# Patient Record
Sex: Male | Born: 1975 | Race: Black or African American | Hispanic: No | Marital: Single | Smoking: Current every day smoker
Health system: Southern US, Community
[De-identification: ages and names within clinical notes are randomized; demographics above are authoritative.]

## PROBLEM LIST (undated history)

## (undated) DIAGNOSIS — G8929 Other chronic pain: Secondary | ICD-10-CM

## (undated) HISTORY — PX: HERNIA REPAIR: SHX51

## (undated) HISTORY — PX: MOUTH SURGERY: SHX715

---

## 2017-06-11 ENCOUNTER — Ambulatory Visit (INDEPENDENT_AMBULATORY_CARE_PROVIDER_SITE_OTHER): Payer: Medicaid Other | Admitting: Orthopaedic Surgery

## 2017-06-11 ENCOUNTER — Ambulatory Visit (INDEPENDENT_AMBULATORY_CARE_PROVIDER_SITE_OTHER): Payer: Medicaid Other

## 2017-06-11 ENCOUNTER — Encounter (INDEPENDENT_AMBULATORY_CARE_PROVIDER_SITE_OTHER): Payer: Self-pay | Admitting: Orthopaedic Surgery

## 2017-06-11 DIAGNOSIS — M25562 Pain in left knee: Secondary | ICD-10-CM

## 2017-06-11 DIAGNOSIS — M545 Low back pain, unspecified: Secondary | ICD-10-CM | POA: Insufficient documentation

## 2017-06-11 DIAGNOSIS — G8929 Other chronic pain: Secondary | ICD-10-CM

## 2017-06-11 MED ORDER — NABUMETONE 500 MG PO TABS: 500.0000 mg | ORAL_TABLET | Freq: Two times a day (BID) | ORAL | 2 refills | Status: DC | PRN

## 2017-06-11 MED ORDER — TIZANIDINE HCL 4 MG PO TABS: 4.0000 mg | ORAL_TABLET | Freq: Three times a day (TID) | ORAL | 0 refills | Status: DC | PRN

## 2017-06-11 MED ORDER — TRAMADOL HCL 50 MG PO TABS: 100.0000 mg | ORAL_TABLET | Freq: Four times a day (QID) | ORAL | 0 refills | Status: DC | PRN

## 2017-06-11 MED ORDER — LIDOCAINE HCL 1 % IJ SOLN
3.0000 mL | INTRAMUSCULAR | Status: AC | PRN
  Administered 2017-06-11: 3 mL

## 2017-06-11 MED ORDER — METHYLPREDNISOLONE ACETATE 40 MG/ML IJ SUSP
40.0000 mg | INTRAMUSCULAR | Status: AC | PRN
  Administered 2017-06-11: 40 mg via INTRA_ARTICULAR

## 2017-06-11 NOTE — Progress Notes (Signed)
Office Visit Note   Patient: Ricardo Khan           Date of Birth: 01-08-1976           MRN: [id number] Visit Date: 06/11/2017              Requested by: Fleet ContrasAvbuere, Edwin, MD [address] [place], [place] [id number] PCP: [facility]   Assessment & Plan: Visit Diagnoses:  1. Chronic pain of left knee   2. Chronic bilateral low back pain without sciatica     Plan: Given the fact that he has had some epidural injections in the past and likely facet joint injections I would like to repeat his MRI of his lumbar spine to assess where an intervention may be helpful for him because he is getting fed up in terms of not wanting any more injections in his back.  I did offer him a steroid injection his left knee and I think this was good treatment for that and he agreed with this.  I will give him a one-time prescription for tramadol and will refill his Zanaflex.  Also send the Relafen.  I will see him back in 3 weeks he is doing overall and hopefully can go for an MRI of his lumbar spine with him at that visit.  Questions concerns were answered and addressed.  Follow-Up Instructions: Return in about 3 weeks (around 07/02/2017).   Orders:  Orders Placed This Encounter  Procedures  . Large Joint Inj  . XR Knee 1-2 Views Left  . XR Lumbar Spine 2-3 Views   Meds ordered this encounter  Medications  . traMADol (ULTRAM) 50 MG tablet    Sig: Take 2 tablets (100 mg total) every 6 (six) hours as needed by mouth.    Dispense:  60 tablet    Refill:  0  . tiZANidine (ZANAFLEX) 4 MG tablet    Sig: Take 1 tablet (4 mg total) every 8 (eight) hours as needed by mouth for muscle spasms.    Dispense:  60 tablet    Refill:  0      Procedures: Large Joint Inj: L knee on 06/11/2017 2:45 PM Indications: diagnostic evaluation and pain Details: 22 G 1.5 in needle, superolateral approach  Arthrogram: No  Medications: 3 mL lidocaine 1 %; 40 mg methylPREDNISolone acetate 40 MG/ML Outcome: tolerated  well, no immediate complications Procedure, treatment alternatives, risks and benefits explained, specific risks discussed. Consent was given by the patient. Immediately prior to procedure a time out was called to verify the correct patient, procedure, equipment, support staff and site/side marked as required. Patient was prepped and draped in the usual sterile fashion.       Clinical Data: No additional findings.   Subjective: Chief Complaint  Patient presents with  . Left Knee - Pain   The patient is referred from his primary care physician to evaluate chronic low back pain as well as acute left knee pain.  Left knee he twisted about 2 months ago and felt like the patella may have come out of place.  He is someone who has had chronic back pain for some time now.  He said at least 1 or 2 epidural steroid [place] in [place] in that bag.  There is been nothing more recent in the spine is what bothers him the most as well.  He does report some pain in his back side that goes down his leg on occasion.  He denies any change in bowel  or bladder function.  He is currently on indomethacin and has had some Zanaflex as well. HPI  Review of Systems He currently denies any headache, chest pain, shortness of breath, fever, chills, nausea, vomiting.  Objective: Vital Signs: There were no vitals taken for this visit.  Physical Exam He is alert and oriented x3 and in no acute distress Ortho Exam Examination of his left knee shows pain over the medial patellofemoral retinacular ligament and guarding when I try to sublux the patella.  Otherwise the remaining part of his knee is normal exam.  He has some medial tenderness over the medial femoral condyle as well.  Flexion extension lumbar spine causes pain in the paraspinal muscles radiating to the sciatic area on the right side of his back. Specialty Comments:  No specialty comments available.  Imaging: Xr Knee 1-2 Views Left  Result Date:  06/11/2017 2 views of the knee on the left side no acute findings with well-maintained joint space.  Xr Lumbar Spine 2-3 Views  Result Date: 06/11/2017 An AP and lateral of the lumbar spine show slight disc space narrowing at L5-S1 but otherwise of the disc space is appearing more normal with normal lordosis on lateral view and straight alignment on the AP view.    PMFS History: Patient Active Problem List   Diagnosis Date Noted  . Chronic pain of left knee 06/11/2017  . Chronic bilateral low back pain without sciatica 06/11/2017   History reviewed. No pertinent past medical history.  History reviewed. No pertinent family history.  History reviewed. No pertinent surgical history. Social History   Occupational History  . Not on file  Tobacco Use  . Smoking status: Not on file  Substance and Sexual Activity  . Alcohol use: Not on file  . Drug use: Not on file  . Sexual activity: Not on file

## 2017-06-12 ENCOUNTER — Other Ambulatory Visit (INDEPENDENT_AMBULATORY_CARE_PROVIDER_SITE_OTHER): Payer: Self-pay

## 2017-06-12 DIAGNOSIS — M4807 Spinal stenosis, lumbosacral region: Secondary | ICD-10-CM

## 2017-07-02 ENCOUNTER — Ambulatory Visit (INDEPENDENT_AMBULATORY_CARE_PROVIDER_SITE_OTHER): Payer: Medicaid Other | Admitting: Orthopaedic Surgery

## 2017-07-02 ENCOUNTER — Other Ambulatory Visit (INDEPENDENT_AMBULATORY_CARE_PROVIDER_SITE_OTHER): Payer: Self-pay

## 2017-07-02 ENCOUNTER — Encounter (INDEPENDENT_AMBULATORY_CARE_PROVIDER_SITE_OTHER): Payer: Self-pay | Admitting: Orthopaedic Surgery

## 2017-07-02 DIAGNOSIS — G8929 Other chronic pain: Secondary | ICD-10-CM

## 2017-07-02 DIAGNOSIS — M545 Low back pain: Secondary | ICD-10-CM | POA: Diagnosis not present

## 2017-07-02 NOTE — Progress Notes (Signed)
The patient is here for follow-up of his lumbar spine.  Unfortunately we are unable to get an MRI approved through his insurance company because they want him to try other modalities first.  He has a history of low back pain and had a one-time intervention of an [place] in [place].  [place] was living [place] to [place]  He said that injection was very successful and he like to have an injection like that again but we have no records showing what level they injected whether it was a facet injection versus a translaminar type of injection.  He still denies any radicular symptoms going down his leg and is mainly chronic low back pain to the right side.  Is a thin individual.  He has excellent strength and normal sensation in his bilateral lower extremities.  He has pain in the facet joints in the paraspinal muscles of the right side of his lumbar spine.  We will send him to physical therapy to see if we can get this to feel better with any modalities to calm down his pain.  He will try to get information about where he is injection was done in the past in terms of anatomic location of his lumbar spine so we can potentially have him seen by Dr. Alvester MorinNewton here for intervention.  If not we would have to implore again from his insurance company that MRI would be warranted if this next round of conservative treatment fails.  We will see him back in 4 weeks.

## 2017-08-04 ENCOUNTER — Ambulatory Visit (INDEPENDENT_AMBULATORY_CARE_PROVIDER_SITE_OTHER): Payer: Medicaid Other | Admitting: Orthopaedic Surgery

## 2018-06-01 ENCOUNTER — Ambulatory Visit (HOSPITAL_COMMUNITY)
Admission: EM | Admit: 2018-06-01 | Discharge: 2018-06-01 | Disposition: A | Discharge status: Home / Self Care | Payer: Medicaid Other | Attending: Family Medicine | Admitting: Family Medicine

## 2018-06-01 ENCOUNTER — Encounter (HOSPITAL_COMMUNITY): Payer: Self-pay | Admitting: Emergency Medicine

## 2018-06-01 DIAGNOSIS — M545 Low back pain, unspecified: Secondary | ICD-10-CM

## 2018-06-01 MED ORDER — PREDNISONE 10 MG (48) PO TBPK: ORAL_TABLET | ORAL | 0 refills | Status: DC

## 2018-06-01 MED ORDER — CYCLOBENZAPRINE HCL 10 MG PO TABS: ORAL_TABLET | ORAL | 0 refills | Status: DC

## 2018-06-01 NOTE — ED Provider Notes (Signed)
[facility]   [id number] 06/01/18 Arrival Time: 1133  ASSESSMENT & PLAN:  1. Acute right-sided low back pain without sciatica   - acute on chronic  Meds ordered this encounter  Medications  . cyclobenzaprine (FLEXERIL) 10 MG tablet    Sig: Take 1 tablet by mouth 3 times daily as needed for muscle spasm. Warning: May cause drowsiness.    Dispense:  21 tablet    Refill:  0  . predniSONE (STERAPRED UNI-PAK 48 TAB) 10 MG (48) TBPK tablet    Sig: Take as directed.    Dispense:  48 tablet    Refill:  0   Medication sedation precautions. Encouraged ROM as he tolerates. May f/u here as needed. IF treatment above is not helping, I have recommended that he f/u with an orthopaedist to discuss further evaluation and treatment options.  Work note given.  Reviewed expectations re: course of current medical issues. Questions answered. Outlined signs and symptoms indicating need for more acute intervention. Patient verbalized understanding. After Visit Summary given.   SUBJECTIVE: History from: patient.  Ricardo Khan is a 42 y.o. male who presents with complaint of fairly persistent right sided lower back discomfort. Onset gradual, over the past few weeks. Injury/trama: no. History of back problems: same problem that worsens when outside temperatures get cold. Discomfort described as aching without radiation and with occasional sharp exacerbations. Certain movements exacerbate the described discomfort, especially lifting items above head. Better with rest. Extremity sensation changes or weakness: none. Ambulatory without difficulty. Normal bowel/bladder habits: yes. No associated abdominal pain/n/v. Self treatment: has over the counter analgesics without much help. Reports seeing "a specialist" in the past who did back injection and told him that he needs an MRI. Medicaid has not approved per patient. Has done short-term PT in the past; some help.  Reports no fevers, IV drug use,  or recent back surgeries or procedures.  ROS: As per HPI. All other systems negative.    OBJECTIVE:  Vitals:   06/01/18 1228  BP: (!) 136/57  Pulse: (!) 56  Resp: 18  Temp: 97.8 F (36.6 C)  TempSrc: Oral  SpO2: 100%    General appearance: alert; no distress Neck: supple with FROM; without midline tenderness CV: regular without murmer Lungs: unlabored respirations; symmetrical air entry Abdomen: soft, non-tender; non-distended Back: moderate right sided tenderness of his lower paraspinal musculature as well as tenderness over SI joint; FROM at hips; bruising: none; without midline tenderness Extremities: no edema; symmetrical with no gross deformities; normal ROM of bilateral lower extremities Skin: warm and dry Neurologic: normal gait but moves slowly; normal reflexes of RLE and LLE; normal sensation of RLE and LLE; normal strength of RLE and LLE Psychological: alert and cooperative; normal mood and affect  No Known Allergies  PMH: Back pain.  Social History   Socioeconomic History  . Marital status: Single    Spouse name: Not on file  . Number of children: Not on file  . Years of education: Not on file  . Highest education level: Not on file  Occupational History  . Not on file  Social Needs  . Financial resource strain: Not on file  . Food insecurity:    Worry: Not on file    Inability: Not on file  . Transportation needs:    Medical: Not on file    Non-medical: Not on file  Tobacco Use  . Smoking status: Current Every Day Smoker  . Smokeless tobacco: Never Used  Substance and Sexual  Activity  . Alcohol use: Yes  . Drug use: Never  . Sexual activity: Not on file  Lifestyle  . Physical activity:    Days per week: Not on file    Minutes per session: Not on file  . Stress: Not on file  Relationships  . Social connections:    Talks on phone: Not on file    Gets together: Not on file    Attends religious service: Not on file    Active member of club or  organization: Not on file    Attends meetings of clubs or organizations: Not on file    Relationship status: Not on file  . Intimate partner violence:    Fear of current or ex partner: Not on file    Emotionally abused: Not on file    Physically abused: Not on file    Forced sexual activity: Not on file  Other Topics Concern  . Not on file  Social History Narrative  . Not on file   FH: Question of HTN.  History reviewed. No pertinent surgical history.   Mardella Layman, MD 06/01/18 1250

## 2018-06-01 NOTE — ED Triage Notes (Signed)
Pt sts lower back pain chronic in nature worse with cold weather per pt

## 2018-06-01 NOTE — Discharge Instructions (Addendum)

## 2019-03-17 ENCOUNTER — Other Ambulatory Visit: Payer: Self-pay

## 2019-03-17 DIAGNOSIS — Z20822 Contact with and (suspected) exposure to covid-19: Secondary | ICD-10-CM

## 2019-03-18 LAB — NOVEL CORONAVIRUS, NAA: SARS-CoV-2, NAA: NOT DETECTED

## 2019-11-02 ENCOUNTER — Ambulatory Visit (HOSPITAL_COMMUNITY)
Admission: EM | Admit: 2019-11-02 | Discharge: 2019-11-02 | Disposition: A | Discharge status: Home / Self Care | Payer: Medicaid Other | Attending: Internal Medicine | Admitting: Internal Medicine

## 2019-11-02 ENCOUNTER — Other Ambulatory Visit: Payer: Self-pay

## 2019-11-02 ENCOUNTER — Encounter (HOSPITAL_COMMUNITY): Payer: Self-pay | Admitting: Emergency Medicine

## 2019-11-02 DIAGNOSIS — M25461 Effusion, right knee: Secondary | ICD-10-CM | POA: Diagnosis not present

## 2019-11-02 MED ORDER — PREDNISONE 20 MG PO TABS: 20.0000 mg | ORAL_TABLET | Freq: Every day | ORAL | 0 refills | Status: DC

## 2019-11-02 MED ORDER — IBUPROFEN 600 MG PO TABS: 600.0000 mg | ORAL_TABLET | Freq: Four times a day (QID) | ORAL | 0 refills | Status: DC | PRN

## 2019-11-02 NOTE — ED Provider Notes (Signed)
[facility]    CSN: [id number] Arrival date & time: 11/02/19  1359      History   Chief Complaint Chief Complaint  Patient presents with  . Knee Pain    HPI Ricardo Khan is a 44 y.o. male with chronic knee pain comes to urgent care with painful swelling of the right knee.  Symptoms started a few days ago.  Patient has started a new job which requires repetitive movement.  He has been loading in offloading boxes onto pallets.  After a couple of days of work he noticed pain and swelling in the right knee.  He denies any fall or trauma to the knee.  Pain is of moderate severity.  It is aggravated by full flexion of the knee.  No known relieving factors.  Patient has not tried any over-the-counter medications.  No numbness or tingling.  No erythema of the right knee.   HPI  History reviewed. No pertinent past medical history.  Patient Active Problem List   Diagnosis Date Noted  . Chronic pain of left knee 06/11/2017  . Chronic bilateral low back pain without sciatica 06/11/2017    History reviewed. No pertinent surgical history.     Home Medications    Prior to Admission medications   Medication Sig Start Date End Date Taking? Authorizing Provider  ibuprofen (ADVIL) 600 MG tablet Take 1 tablet (600 mg total) by mouth every 6 (six) hours as needed. 11/02/19   Loukas Antonson, Britta Mccreedy, MD  nabumetone (RELAFEN) 500 MG tablet Take 1 tablet (500 mg total) 2 (two) times daily as needed by mouth. 06/11/17   Kathryne Hitch, MD    Family History History reviewed. No pertinent family history.  Social History Social History   Tobacco Use  . Smoking status: Current Every Day Smoker  . Smokeless tobacco: Never Used  Substance Use Topics  . Alcohol use: Yes  . Drug use: Never     Allergies   Patient has no known allergies.   Review of Systems Review of Systems  Constitutional: Positive for activity change. Negative for chills and fever.  Respiratory: Negative.    Gastrointestinal: Negative for nausea and vomiting.  Genitourinary: Negative for discharge and penile pain.  Musculoskeletal: Positive for arthralgias and joint swelling. Negative for back pain and myalgias.  Skin: Negative.   Neurological: Negative for dizziness.     Physical Exam Triage Vital Signs ED Triage Vitals [11/02/19 1443]  Enc Vitals Group     BP 128/83     Pulse Rate (!) 58     Resp 18     Temp 98.9 F (37.2 C)     Temp Source Oral     SpO2 100 %     Weight      Height      Head Circumference      Peak Flow      Pain Score 7     Pain Loc      Pain Edu?      Excl. in GC?    No data found.  Updated Vital Signs BP 128/83 (BP Location: Right Arm)   Pulse (!) 58   Temp 98.9 F (37.2 C) (Oral)   Resp 18   SpO2 100%   Visual Acuity Right Eye Distance:   Left Eye Distance:   Bilateral Distance:    Right Eye Near:   Left Eye Near:    Bilateral Near:     Physical Exam Vitals and nursing note reviewed.  Constitutional:      General: He is not in acute distress.    Appearance: He is not ill-appearing.  Cardiovascular:     Rate and Rhythm: Normal rate and regular rhythm.  Pulmonary:     Effort: Pulmonary effort is normal.     Breath sounds: Normal breath sounds.  Abdominal:     General: Abdomen is flat. There is no distension.     Palpations: There is no mass.     Tenderness: There is no abdominal tenderness.  Musculoskeletal:        General: Swelling and tenderness present.  Skin:    General: Skin is warm.     Capillary Refill: Capillary refill takes less than 2 seconds.  Neurological:     Mental Status: He is alert.      UC Treatments / Results  Labs (all labs ordered are listed, but only abnormal results are displayed) Labs Reviewed - No data to display  EKG   Radiology No results found.  Procedures Procedures (including critical care time)  Medications Ordered in UC Medications - No data to display  Initial Impression /  Assessment and Plan / UC Course  I have reviewed the triage vital signs and the nursing notes.  Pertinent labs & imaging results that were available during my care of the patient were reviewed by me and considered in my medical decision making (see chart for details).     1.  Painful right knee swelling: Ibuprofen 600 mg every 6 hours as needed for pain I contemplated using prednisone but the patient recently had COVID-19 vaccine so I will avoid using steroids at this time Return precautions given No x-rays indicated at this time Right knee brace Gentle range of motion exercises and icing of the right knee. Final Clinical Impressions(s) / UC Diagnoses   Final diagnoses:  Knee effusion, right   Discharge Instructions   None    ED Prescriptions    Medication Sig Dispense Auth. Provider   predniSONE (DELTASONE) 20 MG tablet  (Status: Discontinued) Take 1 tablet (20 mg total) by mouth daily for 5 days. 5 tablet Brandace Cargle, Myrene Galas, MD   ibuprofen (ADVIL) 600 MG tablet Take 1 tablet (600 mg total) by mouth every 6 (six) hours as needed. 30 tablet Birch Farino, Myrene Galas, MD     PDMP not reviewed this encounter.   Chase Picket, MD 11/02/19 [phone]

## 2019-11-02 NOTE — ED Triage Notes (Signed)
Pt sts right knee pain with swelling; pt denies obvious injury but sts similar sx with left knee in past

## 2019-11-22 ENCOUNTER — Emergency Department (HOSPITAL_COMMUNITY): Payer: Medicaid Other

## 2019-11-22 ENCOUNTER — Encounter (HOSPITAL_COMMUNITY): Payer: Self-pay | Admitting: Emergency Medicine

## 2019-11-22 ENCOUNTER — Emergency Department (HOSPITAL_COMMUNITY)
Admission: EM | Admit: 2019-11-22 | Discharge: 2019-11-22 | Disposition: A | Discharge status: Home / Self Care | Payer: Medicaid Other | Attending: Emergency Medicine | Admitting: Emergency Medicine

## 2019-11-22 DIAGNOSIS — F17210 Nicotine dependence, cigarettes, uncomplicated: Secondary | ICD-10-CM | POA: Insufficient documentation

## 2019-11-22 DIAGNOSIS — G8929 Other chronic pain: Secondary | ICD-10-CM | POA: Insufficient documentation

## 2019-11-22 DIAGNOSIS — M545 Low back pain: Secondary | ICD-10-CM | POA: Insufficient documentation

## 2019-11-22 DIAGNOSIS — M25561 Pain in right knee: Secondary | ICD-10-CM | POA: Insufficient documentation

## 2019-11-22 MED ORDER — CYCLOBENZAPRINE HCL 10 MG PO TABS: 10.0000 mg | ORAL_TABLET | Freq: Two times a day (BID) | ORAL | 0 refills | Status: DC | PRN

## 2019-11-22 MED ORDER — LIDOCAINE 5 % EX PTCH
1.0000 | MEDICATED_PATCH | CUTANEOUS | Status: DC
  Administered 2019-11-22: 11:00:00 1 via TRANSDERMAL
  Filled 2019-11-22: qty 1

## 2019-11-22 MED ORDER — IBUPROFEN 800 MG PO TABS
800.0000 mg | ORAL_TABLET | Freq: Once | ORAL | Status: AC
  Administered 2019-11-22: 800 mg via ORAL
  Filled 2019-11-22: qty 1

## 2019-11-22 MED ORDER — NAPROXEN 500 MG PO TABS: 500.0000 mg | ORAL_TABLET | Freq: Two times a day (BID) | ORAL | 0 refills | Status: DC

## 2019-11-22 NOTE — Discharge Instructions (Addendum)
As discussed, your x-rays were negative for any acute abnormalities.  Your back x-ray showed some degenerative disease.  Please call the orthopedic at the number I provided for further evaluation of low back pain and knee pain.  I am sending you home with a pain medication called naproxen.  You may take it twice a day as needed for pain.  I am also sending you home with a muscle relaxer called Flexeril.  You may also take it twice a day as needed for pain.  Medication does cause drowsiness and do not drive or operate machinery while on the medication.  Continue to ice and elevate your right knee.  If symptoms do not improve within the next week, please follow-up with PCP for further evaluation.  I have included the number for [facility] if you do not have a PCP.  Return to the ER for new or worsening symptoms.  You may also purchase over the counter voltaren gel and lidoderm patches for added pain relief.

## 2019-11-22 NOTE — ED Triage Notes (Signed)
Pt has right knee pain for over 2 weeks that he noticed while at work with a lot of moving, pt also reports chronic back that has been bothering him as well. Pt does have mild swelling in right knee and has been wearing a knee brace and seen by UC on 3/30 for same.

## 2019-11-22 NOTE — ED Provider Notes (Signed)
[facility] EMERGENCY DEPARTMENT Provider Note   CSN: [id number] Arrival date & time: 11/22/19  2706     History Chief Complaint  Patient presents with  . Knee Pain  . Back Pain    Ricardo Khan is a 44 y.o. male with no significant past medical history who presents to the ED due to worsening right knee pain for the past 2 weeks associated with edema.  Patient notes swelling is worse after standing on his feet while at work.  Patient denies fever and chills.  Chart reviewed.  Patient was seen at urgent care on 11/02/2019 for the same complaint and prescribed ibuprofen with a knee brace.  He also admits to acute on chronic low back pain.  Has a history of numerous herniated disks in the lumbar region.  Denies previous back surgery.  He notes he has had injections in both his knee and his back with relief in the past.  No recent injections.  Denies saddle paresthesias, bowel/bladder incontinence, lower extremity weakness, IV drug use, and history of cancer.  Denies recent injury to right knee or low back.  Patient notes he recently moved to the area and has been unable to set up outpatient follow-up for his chronic back pain. Denies abdominal pain and urinary symptoms.  History obtained from patient and past medical records. No interpreter used during encounter.      History reviewed. No pertinent past medical history.  Patient Active Problem List   Diagnosis Date Noted  . Chronic pain of left knee 06/11/2017  . Chronic bilateral low back pain without sciatica 06/11/2017    No past surgical history on file.     No family history on file.  Social History   Tobacco Use  . Smoking status: Current Every Day Smoker  . Smokeless tobacco: Never Used  Substance Use Topics  . Alcohol use: Yes  . Drug use: Never    Home Medications Prior to Admission medications   Medication Sig Start Date End Date Taking? Authorizing Provider  cyclobenzaprine (FLEXERIL) 10 MG  tablet Take 1 tablet (10 mg total) by mouth 2 (two) times daily as needed for muscle spasms. 11/22/19   Mannie Stabile, PA-C  ibuprofen (ADVIL) 600 MG tablet Take 1 tablet (600 mg total) by mouth every 6 (six) hours as needed. 11/02/19   Lamptey, Britta Mccreedy, MD  nabumetone (RELAFEN) 500 MG tablet Take 1 tablet (500 mg total) 2 (two) times daily as needed by mouth. 06/11/17   Kathryne Hitch, MD  naproxen (NAPROSYN) 500 MG tablet Take 1 tablet (500 mg total) by mouth 2 (two) times daily. 11/22/19   Mannie Stabile, PA-C    Allergies    Patient has no known allergies.  Review of Systems   Review of Systems  Constitutional: Negative for chills and fever.  Eyes: Negative for visual disturbance.  Gastrointestinal: Negative for abdominal pain.  Genitourinary: Negative for dysuria.  Musculoskeletal: Positive for arthralgias, back pain and joint swelling.  All other systems reviewed and are negative.   Physical Exam Updated Vital Signs BP (!) 142/99 (BP Location: Right Arm)   Pulse 68   Temp 98.1 F (36.7 C) (Oral)   Resp 16   Ht 5\' 7"  (1.702 m)   Wt 68 kg   SpO2 99%   BMI 23.49 kg/m   Physical Exam Vitals and nursing note reviewed.  Constitutional:      General: He is not in acute distress.    Appearance:  He is not ill-appearing.  HENT:     Head: Normocephalic.  Eyes:     Pupils: Pupils are equal, round, and reactive to light.  Cardiovascular:     Rate and Rhythm: Normal rate and regular rhythm.     Pulses: Normal pulses.     Heart sounds: Normal heart sounds. No murmur. No friction rub. No gallop.   Pulmonary:     Effort: Pulmonary effort is normal.     Breath sounds: Normal breath sounds.  Abdominal:     General: Abdomen is flat. There is no distension.     Palpations: Abdomen is soft.     Tenderness: There is no abdominal tenderness. There is no guarding or rebound.  Musculoskeletal:     Cervical back: Neck supple.     Comments: Mild midline lumbar  tenderness. No stepoff or deformity. Bilateral lumbar paraspinal tenderness.  Tenderness palpation throughout right knee with notable edema.  Full range of motion of right knee.  Lower extremities neurovascularly intact.  Full range of motion of right hip and right ankle.  Soft Compartments.  Skin:    General: Skin is warm and dry.  Neurological:     General: No focal deficit present.     Mental Status: He is alert.  Psychiatric:        Mood and Affect: Mood normal.        Behavior: Behavior normal.     ED Results / Procedures / Treatments   Labs (all labs ordered are listed, but only abnormal results are displayed) Labs Reviewed - No data to display  EKG None  Radiology DG Lumbar Spine Complete  Result Date: 11/22/2019 CLINICAL DATA:  Chronic back pain. EXAM: LUMBAR SPINE - COMPLETE 4+ VIEW COMPARISON:  06/11/2017. FINDINGS: Five non-rib-bearing lumbar vertebrae. The L1 vertebra remains transitional with unfused transverse processes. Mild anterior spur formation and moderate disc space narrowing at the L5-S1 level. Mild-to-moderate anterior and lateral spur formation in the lower thoracic spine. No fractures, pars defects or subluxations. IMPRESSION: Degenerative changes, as described above. Electronically Signed   By: Beckie Salts M.D.   On: 11/22/2019 11:21   DG Knee Complete 4 Views Right  Result Date: 11/22/2019 CLINICAL DATA:  Right knee pain for 2 weeks, swelling, no injury. EXAM: RIGHT KNEE - COMPLETE 4+ VIEW COMPARISON:  None. FINDINGS: No joint effusion or acute osseous abnormality. No degenerative changes. IMPRESSION: No acute or degenerative findings. Electronically Signed   By: Leanna Battles M.D.   On: 11/22/2019 11:26    Procedures Procedures (including critical care time)  Medications Ordered in ED Medications  lidocaine (LIDODERM) 5 % 1 patch (1 patch Transdermal Patch Applied 11/22/19 1051)  ibuprofen (ADVIL) tablet 800 mg (800 mg Oral Given 11/22/19 1051)     ED Course  I have reviewed the triage vital signs and the nursing notes.  Pertinent labs & imaging results that were available during my care of the patient were reviewed by me and considered in my medical decision making (see chart for details).    MDM Rules/Calculators/A&P                     44 year old male presents to the ED due to worsening right knee pain x2 weeks associated with edema and acute on chronic low back pain for numerous decades.  Denies recent injury to knee or back.  Denies saddle paresthesias, bowel/bladder incontinence, lower extremity weakness, IV drug use, fever/chills, and history of cancer.  Upon arrival,  stable vitals.  Patient is afebrile, not tachycardic or hypoxic.  Patient in no acute distress and non-ill-appearing.  Tenderness throughout right knee with overlying edema.  Full range of motion of right knee. No erythema or warmth. Doubt septic arthritis.  Mild midline lumbar tenderness palpation in bilateral lumbar paraspinal tenderness.  Patient able to ambulate here in the ED without difficulty.  5/5 strength of bilateral lower extremities. Will obtain x-ray of right knee and lumbar spine to rule out acute abnormalities. Suspect back pain related to herniated disc. No concern for cauda equina or central cord compression. Ibuprofen and Lidoderm patch given here in the ED for pain relief.   Lumbar x-ray personally reviewed which demonstrates: Five non-rib-bearing lumbar vertebrae. The L1 vertebra remains  transitional with unfused transverse processes. Mild anterior spur  formation and moderate disc space narrowing at the L5-S1 level.  Mild-to-moderate anterior and lateral spur formation in the lower  thoracic spine. No fractures, pars defects or subluxations.   Knee x-ray personally reviewed which is negative for bony fracture or knee effusion.  Will discharge patient with symptomatic treatment with naproxen and Flexeril.  Patient discharged with orthopedic  referral.  Advised patient to call the office today to schedule an appointment for further evaluation of low back and right knee pain.  Right knee placed in Ace wrap to help with edema.  RICE discussed with patient. Strict ED precautions discussed with patient. Patient states understanding and agrees to plan. Patient discharged home in no acute distress and stable vitals  Final Clinical Impression(s) / ED Diagnoses Final diagnoses:  Chronic bilateral low back pain without sciatica  Acute pain of right knee    Rx / DC Orders ED Discharge Orders         Ordered    naproxen (NAPROSYN) 500 MG tablet  2 times daily     11/22/19 1152    cyclobenzaprine (FLEXERIL) 10 MG tablet  2 times daily PRN     11/22/19 1152           Suzy Bouchard, PA-C 11/22/19 1158    Charlesetta Shanks, MD 11/29/19 2305

## 2019-11-22 NOTE — ED Notes (Signed)
Pt transported to XR via wheelchair at this time.   

## 2019-12-10 ENCOUNTER — Other Ambulatory Visit: Payer: Self-pay | Admitting: Orthopedic Surgery

## 2019-12-10 DIAGNOSIS — S83241S Other tear of medial meniscus, current injury, right knee, sequela: Secondary | ICD-10-CM

## 2020-01-11 ENCOUNTER — Ambulatory Visit
Admission: RE | Admit: 2020-01-11 | Discharge: 2020-01-11 | Disposition: A | Discharge status: Home / Self Care | Payer: Medicaid Other | Source: Ambulatory Visit | Attending: Orthopedic Surgery | Admitting: Orthopedic Surgery

## 2020-01-11 ENCOUNTER — Other Ambulatory Visit: Payer: Self-pay | Admitting: Orthopedic Surgery

## 2020-01-11 ENCOUNTER — Other Ambulatory Visit: Payer: Self-pay

## 2020-01-11 DIAGNOSIS — S83241S Other tear of medial meniscus, current injury, right knee, sequela: Secondary | ICD-10-CM

## 2020-01-20 ENCOUNTER — Ambulatory Visit (HOSPITAL_COMMUNITY)
Admission: EM | Admit: 2020-01-20 | Discharge: 2020-01-20 | Disposition: A | Discharge status: Home / Self Care | Payer: Medicaid Other | Attending: Family Medicine | Admitting: Family Medicine

## 2020-01-20 ENCOUNTER — Encounter (HOSPITAL_COMMUNITY): Payer: Self-pay

## 2020-01-20 DIAGNOSIS — M2391 Unspecified internal derangement of right knee: Secondary | ICD-10-CM

## 2020-01-20 MED ORDER — TRAMADOL-ACETAMINOPHEN 37.5-325 MG PO TABS: 2.0000 | ORAL_TABLET | Freq: Four times a day (QID) | ORAL | 0 refills | Status: DC | PRN

## 2020-01-20 MED ORDER — MELOXICAM 15 MG PO TABS: 15.0000 mg | ORAL_TABLET | Freq: Every day | ORAL | 0 refills | Status: DC

## 2020-01-20 NOTE — ED Provider Notes (Signed)
[facility]    CSN: [id number] Arrival date & time: 01/20/20  3419      History   Chief Complaint Chief Complaint  Patient presents with  . Knee Injury    HPI Ricardo Khan is a 44 y.o. male.   HPI  Patient injured his knee a couple of months ago.  He was seen and referred to Dr. Marcello Fennel in orthopedics.  Dr. Carola Frost did a regular x-ray and then an MRI.  The MRI shows multiple findings including cartilage loss and torn meniscus. The patient states he is here because he has not been able to get an appointment for follow-up from Dr. Carola Frost He continues to have knee pain, knee swelling, difficulty on his job  History reviewed. No pertinent past medical history.  Patient Active Problem List   Diagnosis Date Noted  . Chronic pain of left knee 06/11/2017  . Chronic bilateral low back pain without sciatica 06/11/2017    History reviewed. No pertinent surgical history.     Home Medications    Prior to Admission medications   Medication Sig Start Date End Date Taking? Authorizing Provider  meloxicam (MOBIC) 15 MG tablet Take 1 tablet (15 mg total) by mouth daily. 01/20/20   Eustace Moore, MD  traMADol-acetaminophen (ULTRACET) 37.5-325 MG tablet Take 2 tablets by mouth every 6 (six) hours as needed for severe pain. 01/20/20   Eustace Moore, MD    Family History Family History  Problem Relation Age of Onset  . Healthy Mother   . Healthy Father     Social History Social History   Tobacco Use  . Smoking status: Current Every Day Smoker    Packs/day: 0.50    Types: Cigarettes  . Smokeless tobacco: Never Used  Substance Use Topics  . Alcohol use: Yes    Comment: occ  . Drug use: Never     Allergies   Patient has no known allergies.   Review of Systems Review of Systems   Physical Exam Triage Vital Signs ED Triage Vitals [01/20/20 0836]  Enc Vitals Group     BP (!) 133/92     Pulse Rate 65     Resp 16     Temp 98.7 F (37.1 C)     Temp  Source Oral     SpO2 99 %     Weight 150 lb (68 kg)     Height 5\' 8"  (1.727 m)     Head Circumference      Peak Flow      Pain Score 10     Pain Loc      Pain Edu?      Excl. in GC?    No data found.  Updated Vital Signs BP (!) 133/92   Pulse 65   Temp 98.7 F (37.1 C) (Oral)   Resp 16   Ht 5\' 8"  (1.727 m)   Wt 68 kg   SpO2 99%   BMI 22.81 kg/m       Physical Exam Constitutional:      General: He is not in acute distress.    Appearance: He is well-developed.  HENT:     Head: Normocephalic and atraumatic.  Eyes:     Conjunctiva/sclera: Conjunctivae normal.     Pupils: Pupils are equal, round, and reactive to light.  Cardiovascular:     Rate and Rhythm: Normal rate.  Pulmonary:     Effort: Pulmonary effort is normal. No respiratory distress.  Musculoskeletal:  General: Normal range of motion.     Cervical back: Normal range of motion.     Comments: Right knee has warmth, effusion, tenderness around the joint line.  No instability  Skin:    General: Skin is warm and dry.  Neurological:     Mental Status: He is alert.  Psychiatric:        Mood and Affect: Mood normal.        Behavior: Behavior normal.      UC Treatments / Results  Labs (all labs ordered are listed, but only abnormal results are displayed) Labs Reviewed - No data to display  EKG   Radiology No results found.  Procedures Procedures (including critical care time)  Medications Ordered in UC Medications - No data to display  Initial Impression / Assessment and Plan / UC Course  I have reviewed the triage vital signs and the nursing notes.  Pertinent labs & imaging results that were available during my care of the patient were reviewed by me and considered in my medical decision making (see chart for details).     We will refill an anti-inflammatory.  We will try Mobic once a day.  I am giving him Ultracet for severe pain.  He is cautioned that this may cause drowsiness.  He  is told he will not get refills of his controlled medication.  I called Dr. Carlean Jews office and secured an appointment for him next week. Final Clinical Impressions(s) / UC Diagnoses   Final diagnoses:  Internal derangement of multiple sites of right knee     Discharge Instructions     Take meloxicam once a day This is an anti-inflammatory medicine (do not take Advil while you are on meloxicam) Take Ultracet as needed for severe pain This will help you at bedtime/after work Follow-up with Dr. Marcelino Scot as scheduled Wednesday 01/26/20 at 11:15    ED Prescriptions    Medication Sig Dispense Auth. Provider   meloxicam (MOBIC) 15 MG tablet Take 1 tablet (15 mg total) by mouth daily. 30 tablet Raylene Everts, MD   traMADol-acetaminophen (ULTRACET) 37.5-325 MG tablet Take 2 tablets by mouth every 6 (six) hours as needed for severe pain. 30 tablet Raylene Everts, MD     I have reviewed the PDMP during this encounter.   Raylene Everts, MD 01/20/20 [phone]

## 2020-01-20 NOTE — ED Triage Notes (Signed)
Pt twisted right knee 2 mos ago and states the knee has been swelling ever since. Pt has trace swelling of right knee. Pt c/o 10/10 sharp pain in right knee. Pt has slight limp to exam room.

## 2020-01-20 NOTE — Discharge Instructions (Addendum)
Take meloxicam once a day This is an anti-inflammatory medicine (do not take Advil while you are on meloxicam) Take Ultracet as needed for severe pain This will help you at bedtime/after work Follow-up with Dr. Carola Frost as scheduled Wednesday 01/26/20 at 11:15

## 2020-02-16 ENCOUNTER — Other Ambulatory Visit: Payer: Self-pay

## 2020-02-16 ENCOUNTER — Ambulatory Visit (INDEPENDENT_AMBULATORY_CARE_PROVIDER_SITE_OTHER): Payer: Medicaid Other | Admitting: Orthopaedic Surgery

## 2020-02-16 DIAGNOSIS — S83241D Other tear of medial meniscus, current injury, right knee, subsequent encounter: Secondary | ICD-10-CM | POA: Diagnosis not present

## 2020-02-16 DIAGNOSIS — M1711 Unilateral primary osteoarthritis, right knee: Secondary | ICD-10-CM | POA: Diagnosis not present

## 2020-02-16 MED ORDER — METHYLPREDNISOLONE ACETATE 40 MG/ML IJ SUSP
40.0000 mg | INTRAMUSCULAR | Status: AC | PRN
  Administered 2020-02-16: 40 mg via INTRA_ARTICULAR

## 2020-02-16 MED ORDER — LIDOCAINE HCL 1 % IJ SOLN
3.0000 mL | INTRAMUSCULAR | Status: AC | PRN
  Administered 2020-02-16: 3 mL

## 2020-02-16 NOTE — Progress Notes (Signed)
Office Visit Note   Patient: Ricardo Khan           Date of Birth: 1976-06-08           MRN: [id number] Visit Date: 02/16/2020              Requested by: No referring provider defined for this encounter. PCP: Patient, No Pcp Per   Assessment & Plan: Visit Diagnoses:  1. Unilateral primary osteoarthritis, right knee   2. Acute medial meniscal tear, right, subsequent encounter     Plan: As he had done so well in the past with a steroid injection in his left knee, I recommended this for his right knee and he requested this as well.  He understands the risk and benefits of steroid injections.  He tolerated it well.  I would like to see him back in a month to see how he is doing overall.  Follow-Up Instructions: Return in about 4 weeks (around 03/15/2020).   Orders:  Orders Placed This Encounter  Procedures  . Large Joint Inj   No orders of the defined types were placed in this encounter.     Procedures: Large Joint Inj: R knee on 02/16/2020 11:00 AM Indications: diagnostic evaluation and pain Details: 22 G 1.5 in needle, superolateral approach  Arthrogram: No  Medications: 3 mL lidocaine 1 %; 40 mg methylPREDNISolone acetate 40 MG/ML Outcome: tolerated well, no immediate complications Procedure, treatment alternatives, risks and benefits explained, specific risks discussed. Consent was given by the patient. Immediately prior to procedure a time out was called to verify the correct patient, procedure, equipment, support staff and site/side marked as required. Patient was prepped and draped in the usual sterile fashion.       Clinical Data: No additional findings.   Subjective: Chief Complaint  Patient presents with  . Right Knee - Pain  The patient is seen here today for evaluation treatment of acute right knee pain.  He injured his knee 2-1/2 months ago and is at work and twisted the knee.  He felt a pop in the knee.  Eventually an MRI was obtained showing moderate to  severe arthritis in the medial compartment of his knee.  This is surprising for someone who is only 45 years old and very thin.  He does do a lot of work on his knees.  I actually saw him years ago and provide a steroid injection in his left knee for something similar.  He said that pain is gone and has never bothered him since then.  He has not had any type of steroid injection in his more recently injured right knee.  I believe the injection was held off before because he had had his second Covid vaccine in April and you always want a wait least 2 weeks after the Covid vaccination before placing a steroid in the body.  He is wearing an Ace wrap around his knee.  He does report some locking catching but most of his pain is around the medial joint line. HPI  Review of Systems He currently denies any headache, chest pain, shortness of breath, fever, chills, nausea, vomiting  Objective: Vital Signs: There were no vitals taken for this visit.  Physical Exam He is alert and orient x3 and in no acute distress Ortho Exam Examination of his right knee shows slight varus malalignment.  There is medial joint line tenderness.  There is no true McMurray's or Lachman's exam.  There is no effusion.  His range  of motion is full. Specialty Comments:  No specialty comments available.  Imaging: No results found. The MRI of his right knee does show small meniscal tearing of the medial meniscus.  There is areas of partial full-thickness cartilage loss in the medial compartment of the knee and reactive subchondral edema in the bone.    PMFS History: Patient Active Problem List   Diagnosis Date Noted  . Unilateral primary osteoarthritis, right knee 02/16/2020  . Chronic pain of left knee 06/11/2017  . Chronic bilateral low back pain without sciatica 06/11/2017   No past medical history on file.  Family History  Problem Relation Age of Onset  . Healthy Mother   . Healthy Father     No past surgical  history on file. Social History   Occupational History  . Not on file  Tobacco Use  . Smoking status: Current Every Day Smoker    Packs/day: 0.50    Types: Cigarettes  . Smokeless tobacco: Never Used  Substance and Sexual Activity  . Alcohol use: Yes    Comment: occ  . Drug use: Never  . Sexual activity: Not on file

## 2020-03-15 ENCOUNTER — Encounter: Payer: Self-pay | Admitting: Orthopaedic Surgery

## 2020-03-15 ENCOUNTER — Ambulatory Visit: Payer: Medicaid Other | Admitting: Orthopaedic Surgery

## 2020-03-15 ENCOUNTER — Ambulatory Visit (INDEPENDENT_AMBULATORY_CARE_PROVIDER_SITE_OTHER): Payer: Medicaid Other | Admitting: Physician Assistant

## 2020-03-15 DIAGNOSIS — S83241D Other tear of medial meniscus, current injury, right knee, subsequent encounter: Secondary | ICD-10-CM

## 2020-03-15 NOTE — Progress Notes (Signed)
HPI: Ricardo Khan returns today follow-up of his right knee status post injection with cortisone 02/16/2020.  States the injection took away the severe pain initially but the pain came back and he continues to have giving way sensation of the knee anytime he twists he has a painful pop within the knee.  Again he had an acute injury approximately 3-1/2 months ago at work and this was a twisting injury.  He states most of his pain is along medial joint line.  Then MRI of the knee did show moderate to severe arthritis medial compartment of his knee and mild cartilage fissuring along the posterior weightbearing portion of the lateral femoral condyle.  Also partial-thickness cartilage loss the posterior lateral tibial plateau.  Tiny or radial tear is seen the medial meniscus and also small tear along the edge of the posterior horn of the medial meniscus.  Radial tear lateral meniscus involving the body.  Review of systems: Please see HPI otherwise negative noncontributory.  Physical exam: General well-developed well-nourished male in no acute distress walks with an antalgic gait without the use of any assistive device. Right knee: Tenderness along medial joint line.  No abnormal warmth erythema or effusion.  McMurray's is positive.   Impression: Right knee acute meniscal tear Right knee arthritis right is  Plan: Due to the fact the patient is failed conservative treatment thus far and given his young age recommend knee arthroscopy for partial medial meniscectomy.  He understands this will not address the arthritic components of his knee.  He may benefit from supplemental injections in the past.  However given in his young age with thin body build and the fact he had no pain in the knee prior to 3-1/2 months ago when he had a twisting feel that most conservative treatment would be knee arthroscopy at this point time.  He understands risk benefits of surgery which include but are not limited to nerve vessel injury,  worsening pain, prolonged pain, DVT/PE and infection.  We will see him back 1 week postop.  Questions were encouraged and answered at length today

## 2020-09-20 ENCOUNTER — Ambulatory Visit (INDEPENDENT_AMBULATORY_CARE_PROVIDER_SITE_OTHER): Payer: Medicaid Other | Admitting: Physician Assistant

## 2020-09-20 ENCOUNTER — Encounter: Payer: Self-pay | Admitting: Physician Assistant

## 2020-09-20 ENCOUNTER — Ambulatory Visit: Payer: Self-pay

## 2020-09-20 DIAGNOSIS — M1711 Unilateral primary osteoarthritis, right knee: Secondary | ICD-10-CM | POA: Diagnosis not present

## 2020-09-20 DIAGNOSIS — S83241D Other tear of medial meniscus, current injury, right knee, subsequent encounter: Secondary | ICD-10-CM | POA: Diagnosis not present

## 2020-09-20 DIAGNOSIS — M25562 Pain in left knee: Secondary | ICD-10-CM

## 2020-09-20 DIAGNOSIS — G8929 Other chronic pain: Secondary | ICD-10-CM | POA: Diagnosis not present

## 2020-09-20 MED ORDER — MELOXICAM 15 MG PO TABS: 15.0000 mg | ORAL_TABLET | Freq: Every day | ORAL | 0 refills | Status: DC

## 2020-09-20 NOTE — Progress Notes (Signed)
HPI: Mr. Astorino returns today due to right knee pain.  Again he underwent an MRI of his right knee which showed moderate to severe arthritis in the medial compartment of his knee.  He also had a small meniscal tear in the medial meniscus.  And reactive subchondral cyst edema in the medial femoral condyle.  He states he continues to have giving way of the knee sharp pain.  He notes it feels like something is pulling within the knee at times.  He would like to undergo some type of surgery.  Review of systems: No fevers chills shortness of breath chest pain.  Physical exam General well-developed well-nourished male in no acute distress.  Ambulates without any assistive devices.  Right knee: Tenderness along medial joint line.  Slight effusion.  Full range of motion.  Positive McMurray's.  Impression: Right knee medial meniscal tear Right knee osteoarthritis   Plan: Given patient's young age may been like to do as conservative treatment as possible.  Therefore recommend right knee arthroscopy with medial meniscectomy and debridement.  He understands that he does have significant arthritis in his knee and that the knee arthroscopy may not relieve all of his pain.  He most likely will require supplemental injections in the future.  Risk benefits of surgery discussed with patient risk include but are not limited to infection, prolonged pain, and DVT.  He will follow up with [place] 1 week postop.  Questions were encouraged and answered at length.

## 2020-10-05 ENCOUNTER — Telehealth: Payer: Self-pay | Admitting: Orthopaedic Surgery

## 2020-10-05 NOTE — Telephone Encounter (Signed)
I called patient and scheduled surgery. 

## 2020-10-05 NOTE — Telephone Encounter (Signed)
Pt called stating he would like to take the 10/12/20 surgery date, as he has found someone to sit through it; pt would like a CB once that's been booked.   [phone]

## 2020-10-18 ENCOUNTER — Encounter: Payer: Self-pay | Admitting: Physician Assistant

## 2020-10-18 ENCOUNTER — Other Ambulatory Visit: Payer: Self-pay

## 2020-10-18 ENCOUNTER — Ambulatory Visit (INDEPENDENT_AMBULATORY_CARE_PROVIDER_SITE_OTHER): Payer: Medicaid Other | Admitting: Physician Assistant

## 2020-10-18 DIAGNOSIS — S83241D Other tear of medial meniscus, current injury, right knee, subsequent encounter: Secondary | ICD-10-CM

## 2020-10-18 MED ORDER — MELOXICAM 15 MG PO TABS: 15.0000 mg | ORAL_TABLET | Freq: Every day | ORAL | 0 refills | Status: DC

## 2020-10-18 NOTE — Addendum Note (Signed)
Addended by: Richardean Canal on: 10/18/2020 01:14 PM   Modules accepted: Orders

## 2020-10-18 NOTE — Progress Notes (Signed)
Ricardo Khan comes in today for refill on his Mobic.  He is also waiting for approval for right knee arthroscopy.  With working on this.  Domenica Reamer with getting a call wants the time and facility are known.  We will see him back 1 week postop.  No charge for today's office visit.

## 2020-10-25 ENCOUNTER — Telehealth: Payer: Self-pay | Admitting: Orthopaedic Surgery

## 2020-10-25 NOTE — Telephone Encounter (Signed)
Pt called stating he needs the details for his surgery; he states someone called him to set it up but didn't give him a definite date. Pt would like a CB with an update  [phone]

## 2020-10-27 NOTE — Telephone Encounter (Signed)
I called patient and left a voice mail for a return call. 

## 2020-10-31 ENCOUNTER — Other Ambulatory Visit: Payer: Self-pay | Admitting: Physician Assistant

## 2020-11-02 ENCOUNTER — Encounter (HOSPITAL_BASED_OUTPATIENT_CLINIC_OR_DEPARTMENT_OTHER): Payer: Self-pay | Admitting: Orthopaedic Surgery

## 2020-11-02 ENCOUNTER — Other Ambulatory Visit: Payer: Self-pay

## 2020-11-03 NOTE — Telephone Encounter (Signed)
Pt aware of surgery per Tammy at [facility].

## 2020-11-06 ENCOUNTER — Other Ambulatory Visit (HOSPITAL_COMMUNITY)
Admission: RE | Admit: 2020-11-06 | Discharge: 2020-11-06 | Disposition: A | Discharge status: Home / Self Care | Payer: Medicaid Other | Source: Ambulatory Visit | Attending: Orthopaedic Surgery | Admitting: Orthopaedic Surgery

## 2020-11-06 ENCOUNTER — Other Ambulatory Visit: Payer: Self-pay

## 2020-11-06 DIAGNOSIS — Z20822 Contact with and (suspected) exposure to covid-19: Secondary | ICD-10-CM | POA: Diagnosis not present

## 2020-11-06 DIAGNOSIS — Z01812 Encounter for preprocedural laboratory examination: Secondary | ICD-10-CM | POA: Diagnosis not present

## 2020-11-07 LAB — SARS CORONAVIRUS 2 (TAT 6-24 HRS): SARS Coronavirus 2: NEGATIVE

## 2020-11-08 DIAGNOSIS — S83231A Complex tear of medial meniscus, current injury, right knee, initial encounter: Secondary | ICD-10-CM

## 2020-11-08 NOTE — H&P (Signed)
Ricardo Khan is an 45 y.o. male.   Chief Complaint: Right knee pain with swelling, locking and catching HPI: The patient is a 45 year old male with right knee pain this been going on for some time now.  He has tried failed all forms of conservative treatment including activity modification, rest, anti-inflammatories, quad training exercises and steroid injections in that right knee.  A MRI was visualized and of the knee is showing small meniscal tears of the medial lateral meniscus but also subchondral reactive edema on the medial compartment of the knee and some areas of near full-thickness cartilage loss in the medial compartment.  At this point an arthroscopic intervention has been recommended to assess the meniscus and perform any partial meniscectomy as necessary as well as assessing the cartilage of the knee and perform any type of chondroplasty that is indicated.  History reviewed. No pertinent past medical history.  Past Surgical History:  Procedure Laterality Date  . HERNIA REPAIR    . MOUTH SURGERY      Family History  Problem Relation Age of Onset  . Healthy Mother   . Healthy Father    Social History:  reports that he has been smoking cigarettes. He has been smoking about 0.50 packs per day. He has never used smokeless tobacco. He reports current alcohol use. He reports that he does not use drugs.  Allergies: No Known Allergies  No medications prior to admission.    No results found for this or any previous visit (from the past 48 hour(s)). No results found.  Review of Systems  All other systems reviewed and are negative.   Height 5\' 7"  (1.702 m), weight 68 kg. Physical Exam Vitals reviewed.  Constitutional:      Appearance: Normal appearance.  HENT:     Head: Normocephalic and atraumatic.  Eyes:     Extraocular Movements: Extraocular movements intact.     Pupils: Pupils are equal, round, and reactive to light.  Cardiovascular:     Rate and Rhythm: Normal rate  and regular rhythm.     Pulses: Normal pulses.  Pulmonary:     Effort: Pulmonary effort is normal.     Breath sounds: Normal breath sounds.  Abdominal:     Palpations: Abdomen is soft.  Musculoskeletal:     Cervical back: Normal range of motion.     Right knee: Effusion present. Decreased range of motion. Tenderness present over the medial joint line. Abnormal meniscus.  Neurological:     Mental Status: He is alert and oriented to person, place, and time.  Psychiatric:        Behavior: Behavior normal.      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  The risk and benefits of surgery been explained in detail.  , MD 11/08/2020, 9:25 PM

## 2020-11-09 ENCOUNTER — Encounter (HOSPITAL_BASED_OUTPATIENT_CLINIC_OR_DEPARTMENT_OTHER): Payer: Self-pay | Admitting: Orthopaedic Surgery

## 2020-11-09 ENCOUNTER — Other Ambulatory Visit: Payer: Self-pay

## 2020-11-09 ENCOUNTER — Ambulatory Visit (HOSPITAL_BASED_OUTPATIENT_CLINIC_OR_DEPARTMENT_OTHER): Payer: Medicaid Other | Admitting: Anesthesiology

## 2020-11-09 ENCOUNTER — Ambulatory Visit (HOSPITAL_BASED_OUTPATIENT_CLINIC_OR_DEPARTMENT_OTHER)
Admission: RE | Admit: 2020-11-09 | Discharge: 2020-11-09 | Disposition: A | Discharge status: Home / Self Care | Payer: Medicaid Other | Attending: Orthopaedic Surgery | Admitting: Orthopaedic Surgery

## 2020-11-09 ENCOUNTER — Encounter (HOSPITAL_BASED_OUTPATIENT_CLINIC_OR_DEPARTMENT_OTHER)
Admission: RE | Disposition: A | Discharge status: Home / Self Care | Payer: Self-pay | Source: Home / Self Care | Attending: Orthopaedic Surgery

## 2020-11-09 DIAGNOSIS — X58XXXA Exposure to other specified factors, initial encounter: Secondary | ICD-10-CM | POA: Insufficient documentation

## 2020-11-09 DIAGNOSIS — F17210 Nicotine dependence, cigarettes, uncomplicated: Secondary | ICD-10-CM | POA: Diagnosis not present

## 2020-11-09 DIAGNOSIS — S83231A Complex tear of medial meniscus, current injury, right knee, initial encounter: Secondary | ICD-10-CM | POA: Diagnosis present

## 2020-11-09 DIAGNOSIS — Z8719 Personal history of other diseases of the digestive system: Secondary | ICD-10-CM | POA: Insufficient documentation

## 2020-11-09 DIAGNOSIS — S83231D Complex tear of medial meniscus, current injury, right knee, subsequent encounter: Secondary | ICD-10-CM | POA: Diagnosis not present

## 2020-11-09 HISTORY — PX: KNEE ARTHROSCOPY: SHX127

## 2020-11-09 SURGERY — ARTHROSCOPY, KNEE
Anesthesia: General | Site: Knee | Laterality: Right

## 2020-11-09 MED ORDER — GLYCOPYRROLATE 0.2 MG/ML IJ SOLN
INTRAMUSCULAR | Status: DC | PRN
  Administered 2020-11-09: .2 mg via INTRAVENOUS

## 2020-11-09 MED ORDER — KETOROLAC TROMETHAMINE 30 MG/ML IJ SOLN
30.0000 mg | Freq: Once | INTRAMUSCULAR | Status: AC | PRN
  Administered 2020-11-09: 30 mg via INTRAVENOUS

## 2020-11-09 MED ORDER — ACETAMINOPHEN 10 MG/ML IV SOLN
INTRAVENOUS | Status: AC
  Filled 2020-11-09: qty 100

## 2020-11-09 MED ORDER — DEXAMETHASONE SODIUM PHOSPHATE 4 MG/ML IJ SOLN
INTRAMUSCULAR | Status: DC | PRN
  Administered 2020-11-09: 10 mg via INTRAVENOUS

## 2020-11-09 MED ORDER — EPHEDRINE SULFATE 50 MG/ML IJ SOLN
INTRAMUSCULAR | Status: DC | PRN
  Administered 2020-11-09: 5 mg via INTRAVENOUS

## 2020-11-09 MED ORDER — LACTATED RINGERS IV SOLN: INTRAVENOUS | Status: DC

## 2020-11-09 MED ORDER — MORPHINE SULFATE (PF) 4 MG/ML IV SOLN
INTRAVENOUS | Status: DC | PRN
  Administered 2020-11-09: 4 mg via INTRAVENOUS

## 2020-11-09 MED ORDER — MIDAZOLAM HCL 5 MG/5ML IJ SOLN
INTRAMUSCULAR | Status: DC | PRN
  Administered 2020-11-09: 2 mg via INTRAVENOUS

## 2020-11-09 MED ORDER — SODIUM CHLORIDE 0.9 % IR SOLN
Status: DC | PRN
  Administered 2020-11-09: 3000 mL

## 2020-11-09 MED ORDER — MEPERIDINE HCL 25 MG/ML IJ SOLN: 6.2500 mg | INTRAMUSCULAR | Status: DC | PRN

## 2020-11-09 MED ORDER — OXYCODONE HCL 5 MG PO TABS: 5.0000 mg | ORAL_TABLET | Freq: Once | ORAL | Status: DC | PRN

## 2020-11-09 MED ORDER — ONDANSETRON HCL 4 MG/2ML IJ SOLN
INTRAMUSCULAR | Status: AC
  Filled 2020-11-09: qty 2

## 2020-11-09 MED ORDER — CEFAZOLIN SODIUM-DEXTROSE 2-4 GM/100ML-% IV SOLN
INTRAVENOUS | Status: AC
  Filled 2020-11-09: qty 100

## 2020-11-09 MED ORDER — PROPOFOL 10 MG/ML IV BOLUS
INTRAVENOUS | Status: DC | PRN
  Administered 2020-11-09: 200 mg via INTRAVENOUS

## 2020-11-09 MED ORDER — DEXAMETHASONE SODIUM PHOSPHATE 10 MG/ML IJ SOLN
INTRAMUSCULAR | Status: AC
  Filled 2020-11-09: qty 1

## 2020-11-09 MED ORDER — FENTANYL CITRATE (PF) 100 MCG/2ML IJ SOLN
INTRAMUSCULAR | Status: DC | PRN
  Administered 2020-11-09 (×3): 50 ug via INTRAVENOUS

## 2020-11-09 MED ORDER — FENTANYL CITRATE (PF) 100 MCG/2ML IJ SOLN
INTRAMUSCULAR | Status: AC
  Filled 2020-11-09: qty 2

## 2020-11-09 MED ORDER — LIDOCAINE 2% (20 MG/ML) 5 ML SYRINGE
INTRAMUSCULAR | Status: AC
  Filled 2020-11-09: qty 5

## 2020-11-09 MED ORDER — HYDROMORPHONE HCL 1 MG/ML IJ SOLN
0.2500 mg | INTRAMUSCULAR | Status: DC | PRN
  Administered 2020-11-09 (×2): 0.5 mg via INTRAVENOUS

## 2020-11-09 MED ORDER — HYDROCODONE-ACETAMINOPHEN 5-325 MG PO TABS: 1.0000 | ORAL_TABLET | Freq: Four times a day (QID) | ORAL | 0 refills | Status: DC | PRN

## 2020-11-09 MED ORDER — ACETAMINOPHEN 500 MG PO TABS: 1000.0000 mg | ORAL_TABLET | Freq: Once | ORAL | Status: DC

## 2020-11-09 MED ORDER — OXYCODONE HCL 5 MG/5ML PO SOLN: 5.0000 mg | Freq: Once | ORAL | Status: DC | PRN

## 2020-11-09 MED ORDER — HYDROMORPHONE HCL 1 MG/ML IJ SOLN
INTRAMUSCULAR | Status: AC
  Filled 2020-11-09: qty 0.5

## 2020-11-09 MED ORDER — KETOROLAC TROMETHAMINE 30 MG/ML IJ SOLN
INTRAMUSCULAR | Status: AC
  Filled 2020-11-09: qty 1

## 2020-11-09 MED ORDER — MIDAZOLAM HCL 2 MG/2ML IJ SOLN
INTRAMUSCULAR | Status: AC
  Filled 2020-11-09: qty 2

## 2020-11-09 MED ORDER — DEXMEDETOMIDINE (PRECEDEX) IN NS 20 MCG/5ML (4 MCG/ML) IV SYRINGE
PREFILLED_SYRINGE | INTRAVENOUS | Status: DC | PRN
  Administered 2020-11-09: 8 ug via INTRAVENOUS

## 2020-11-09 MED ORDER — BUPIVACAINE HCL (PF) 0.5 % IJ SOLN
INTRAMUSCULAR | Status: DC | PRN
  Administered 2020-11-09: 30 mL

## 2020-11-09 MED ORDER — LIDOCAINE HCL (CARDIAC) PF 100 MG/5ML IV SOSY
PREFILLED_SYRINGE | INTRAVENOUS | Status: DC | PRN
  Administered 2020-11-09: 60 mg via INTRAVENOUS

## 2020-11-09 MED ORDER — ACETAMINOPHEN 10 MG/ML IV SOLN
1000.0000 mg | Freq: Four times a day (QID) | INTRAVENOUS | Status: DC
  Administered 2020-11-09: 1000 mg via INTRAVENOUS

## 2020-11-09 MED ORDER — PROMETHAZINE HCL 25 MG/ML IJ SOLN: 6.2500 mg | INTRAMUSCULAR | Status: DC | PRN

## 2020-11-09 MED ORDER — CEFAZOLIN SODIUM-DEXTROSE 2-4 GM/100ML-% IV SOLN
2.0000 g | INTRAVENOUS | Status: AC
  Administered 2020-11-09: 2 g via INTRAVENOUS

## 2020-11-09 MED ORDER — ONDANSETRON HCL 4 MG/2ML IJ SOLN
INTRAMUSCULAR | Status: DC | PRN
  Administered 2020-11-09: 4 mg via INTRAVENOUS

## 2020-11-09 MED ORDER — MORPHINE SULFATE (PF) 4 MG/ML IV SOLN
INTRAVENOUS | Status: AC
  Filled 2020-11-09: qty 1

## 2020-11-09 SURGICAL SUPPLY — 33 items
BLADE EXCALIBUR 4.0X13 (MISCELLANEOUS) IMPLANT
BNDG ELASTIC 6X5.8 VLCR STR LF (GAUZE/BANDAGES/DRESSINGS) ×2 IMPLANT
COVER WAND RF STERILE (DRAPES) IMPLANT
DISSECTOR  3.8MM X 13CM (MISCELLANEOUS) ×1
DISSECTOR 3.8MM X 13CM (MISCELLANEOUS) ×1 IMPLANT
DRAPE ARTHROSCOPY W/POUCH 90 (DRAPES) ×2 IMPLANT
DRAPE U-SHAPE 47X51 STRL (DRAPES) ×2 IMPLANT
DRSG PAD ABDOMINAL 8X10 ST (GAUZE/BANDAGES/DRESSINGS) ×2 IMPLANT
DURAPREP 26ML APPLICATOR (WOUND CARE) ×2 IMPLANT
ELECT MENISCUS 165MM 90D (ELECTRODE) IMPLANT
ELECT REM PT RETURN 9FT ADLT (ELECTROSURGICAL)
ELECTRODE REM PT RTRN 9FT ADLT (ELECTROSURGICAL) IMPLANT
EXCALIBUR 3.8MM X 13CM (MISCELLANEOUS) IMPLANT
GAUZE SPONGE 4X4 12PLY STRL (GAUZE/BANDAGES/DRESSINGS) ×2 IMPLANT
GAUZE XEROFORM 1X8 LF (GAUZE/BANDAGES/DRESSINGS) ×2 IMPLANT
GLOVE ORTHO TXT STRL SZ7.5 (GLOVE) ×2 IMPLANT
GLOVE SRG 8 PF TXTR STRL LF DI (GLOVE) ×2 IMPLANT
GLOVE SURG ORTHO LTX SZ8 (GLOVE) ×2 IMPLANT
GLOVE SURG UNDER POLY LF SZ8 (GLOVE) ×4
GOWN STRL REUS W/ TWL LRG LVL3 (GOWN DISPOSABLE) ×2 IMPLANT
GOWN STRL REUS W/ TWL XL LVL3 (GOWN DISPOSABLE) ×1 IMPLANT
GOWN STRL REUS W/TWL LRG LVL3 (GOWN DISPOSABLE) ×4
GOWN STRL REUS W/TWL XL LVL3 (GOWN DISPOSABLE) ×2
MANIFOLD NEPTUNE II (INSTRUMENTS) ×2 IMPLANT
PACK ARTHROSCOPY DSU (CUSTOM PROCEDURE TRAY) ×2 IMPLANT
PACK BASIN DAY SURGERY FS (CUSTOM PROCEDURE TRAY) ×2 IMPLANT
PADDING CAST COTTON 6X4 STRL (CAST SUPPLIES) ×2 IMPLANT
PENCIL SMOKE EVACUATOR (MISCELLANEOUS) IMPLANT
SUT ETHILON 3 0 PS 1 (SUTURE) ×2 IMPLANT
TOWEL GREEN STERILE FF (TOWEL DISPOSABLE) ×2 IMPLANT
TUBING ARTHROSCOPY IRRIG 16FT (MISCELLANEOUS) ×2 IMPLANT
WATER STERILE IRR 1000ML POUR (IV SOLUTION) ×2 IMPLANT
WRAP KNEE MAXI GEL POST OP (GAUZE/BANDAGES/DRESSINGS) ×2 IMPLANT

## 2020-11-09 NOTE — Op Note (Signed)
Ricardo Khan, Ricardo Khan MEDICAL RECORD NO: [id number] ACCOUNT NO: [id number] DATE OF BIRTH: 1976/04/24 FACILITY: [facility] LOCATION: [facility] PHYSICIAN: Vanita Panda. Magnus Ivan, MD  Operative Report   DATE OF PROCEDURE: 11/09/2020  PREOPERATIVE DIAGNOSIS:  Right knee with complex medial meniscal tear.  POSTOPERATIVE DIAGNOSIS:  Right knee with complex medial meniscal tear.  PROCEDURE PERFORMED:  Right knee arthroscopy with partial medial meniscectomy.  FINDINGS:  Complex posterior horn to mid body medial meniscal tear with grade 3 chondromalacia of the medial femoral condyle.  SURGEON:  Vanita Panda. Magnus Ivan, MD  ASSISTANT:  Richardean Canal, PA-C  ANESTHESIA: 1.  General. 2.  Local right knee block with Marcaine and morphine.  ESTIMATED BLOOD LOSS:  Minimal.  COMPLICATIONS:  None.  INDICATIONS:  The patient is a 45 year old gentleman well known to me.  He has been having debilitating right knee pain with swelling and locking and catching.  We have tried and failed conservative treatment for a long period of time.  An MRI of his  right knee was obtained and did show a medial meniscal tear with thinning of the cartilage on the medial compartment of his knee.  He is a very thin individual and wishes to proceed with an arthroscopic intervention at this point given his mechanical  symptoms and the MRI findings combined with a fair amount of conservative treatment.  I agree with this as well based on what we are seeing on clinical exam and the MRI.  We had a long and thorough discussion about the risks and benefits of the surgery.  DESCRIPTION OF PROCEDURE:  After informed consent was obtained, appropriate right knee was marked, he was brought to the operating room and placed supine on the operating table.  General anesthesia was then obtained.  His right thigh, knee, leg, and  ankle were prepped and draped in DuraPrep and sterile drapes including a sterile stockinette.  With the lateral leg  post utilized and the bed raised the right operative knee was flexed off the side of the table.  A timeout was called and he was  identified correct patient, correct right knee.  We then made an anterolateral arthroscopy portal and inserted the cannula into the knee.  We did not find any significant effusion.  We placed the camera in the knee and went to the medial compartment.   Right away, we could see there was a wide area of grade 3 chondromalacia of medial femoral condyle.  There was a very large complex flap tear of the medial meniscus from the posterior horn to the mid body.  Using arthroscopic shaver through a  anteromedial portal combined with arthroscopic basket forceps/biters, we were able to perform a partial medial meniscectomy and get this back to a stable margin.  There is meniscal tissue remaining.  We then used the arthroscopic shaver to perform an  abrasion chondroplasty over the medial femoral condyle where there was friable cartilage.  The ACL and PCL were found to be intact and the lateral compartment was intact.  The patellofemoral joint also was intact.  We then allowed fluid lavage of the  knee and drained all fluid from the knee.  We closed the portal sites with interrupted nylon suture and then placed mixture of Marcaine and morphine into the knee.  Xeroform well-padded sterile dressing was applied.  He was awakened, extubated, and taken  to recovery room in stable condition with all final counts being correct and no complications noted.   PUS D: 11/09/2020 9:31:59 am T: 11/09/2020  10:30:00 am  JOB: [id number]

## 2020-11-09 NOTE — Discharge Instructions (Signed)
You may increase her activities as comfort allows. You may put all of your weight on your right knee today as comfort allows. Today you should rest occasionally with ice and elevation for your right knee due to knee swelling intermittently throughout the day. In 24 hours you can remove the dressings and shower getting your knee and incisions wet in the shower daily. Place small Band-Aids over each incision daily after shower.   Post Anesthesia Home Care Instructions  Activity: Get plenty of rest for the remainder of the day. A responsible individual must stay with you for 24 hours following the procedure.  For the next 24 hours, DO NOT: -Drive a car -Advertising copywriter -Drink alcoholic beverages -Take any medication unless instructed by your physician -Make any legal decisions or sign important papers.  Meals: Start with liquid foods such as gelatin or soup. Progress to regular foods as tolerated. Avoid greasy, spicy, heavy foods. If nausea and/or vomiting occur, drink only clear liquids until the nausea and/or vomiting subsides. Call your physician if vomiting continues.  Special Instructions/Symptoms: Your throat may feel dry or sore from the anesthesia or the breathing tube placed in your throat during surgery. If this causes discomfort, gargle with warm salt water. The discomfort should disappear within 24 hours.  If you had a scopolamine patch placed behind your ear for the management of post- operative nausea and/or vomiting:  1. The medication in the patch is effective for 72 hours, after which it should be removed.  Wrap patch in a tissue and discard in the trash. Wash hands thoroughly with soap and water. 2. You may remove the patch earlier than 72 hours if you experience unpleasant side effects which may include dry mouth, dizziness or visual disturbances. 3. Avoid touching the patch. Wash your hands with soap and water after contact with the patch.

## 2020-11-09 NOTE — Anesthesia Postprocedure Evaluation (Signed)
Anesthesia Post Note  Patient: Ricardo Khan  Procedure(s) Performed: RIGHT KNEE ARTHROSCOPY WITH PARTIAL MEDIAL MENISCECTOMY (Right Knee)     Patient location during evaluation: PACU Anesthesia Type: General Level of consciousness: awake and alert, oriented and patient cooperative Pain management: pain level controlled Vital Signs Assessment: post-procedure vital signs reviewed and stable Respiratory status: spontaneous breathing, nonlabored ventilation and respiratory function stable Cardiovascular status: blood pressure returned to baseline and stable Postop Assessment: no apparent nausea or vomiting Anesthetic complications: no   No complications documented.  Last Vitals:  Vitals:   11/09/20 0739 11/09/20 0945  BP: (!) 170/76 (!) 99/58  Pulse: (!) 56 (!) 50  Resp: 16 11  Temp: 36.8 C (!) 36.2 C  SpO2: 100% 100%    Last Pain:  Vitals:   11/09/20 0945  TempSrc:   PainSc: Asleep        RLE Motor Response:  (UTA due to sedation) (11/09/20 0945)        Lannie Fields

## 2020-11-09 NOTE — Interval H&P Note (Signed)
History and Physical Interval Note: The patient understands that he is here today for right knee arthroscopy.  There has been no acute or interval change in medical status.  See recent H&P.  The risks and benefits of surgery been explained in detail and informed consent is obtained.  The right knee has been marked.  11/09/2020 8:46 AM  Ricardo Khan Ricardo Khan  has presented today for surgery, with the diagnosis of right knee medial meniscal tear.  The various methods of treatment have been discussed with the patient and family. After consideration of risks, benefits and other options for treatment, the patient has consented to  Procedure(s): RIGHT KNEE ARTHROSCOPY WITH PARTIAL MEDIAL MENISCECTOMY (Right) as a surgical intervention.  The patient's history has been reviewed, patient examined, no change in status, stable for surgery.  I have reviewed the patient's chart and labs.  Questions were answered to the patient's satisfaction.     Kathryne Hitch

## 2020-11-09 NOTE — Brief Op Note (Signed)
11/09/2020  9:37 AM  PATIENT:  Ricardo Khan  45 y.o. male  PRE-OPERATIVE DIAGNOSIS:  right knee medial meniscal tear  POST-OPERATIVE DIAGNOSIS:  right knee medial meniscal tear  PROCEDURE:  Procedure(s): RIGHT KNEE ARTHROSCOPY WITH PARTIAL MEDIAL MENISCECTOMY (Right)  SURGEON:  Surgeon(s) and Role:    Kathryne Hitch, MD - Primary  PHYSICIAN ASSISTANT: Rexene Edison, PA-C  ANESTHESIA:   local and general  EBL:  10 mL   COUNTS:  YES  PLAN OF CARE: Discharge to home after PACU  PATIENT DISPOSITION:  PACU - hemodynamically stable.   Delay start of Pharmacological VTE agent (>24hrs) due to surgical blood loss or risk of bleeding: no

## 2020-11-09 NOTE — Anesthesia Procedure Notes (Signed)
Procedure Name: LMA Insertion Performed by: Tamiya Colello, Cullen, CRNA Pre-anesthesia Checklist: Patient identified, Emergency Drugs available, Suction available and Patient being monitored Patient Re-evaluated:Patient Re-evaluated prior to induction Oxygen Delivery Method: Circle system utilized Preoxygenation: Pre-oxygenation with 100% oxygen Induction Type: IV induction Ventilation: Mask ventilation without difficulty LMA: LMA inserted LMA Size: 4.0 Number of attempts: 1 Airway Equipment and Method: Bite block Placement Confirmation: positive ETCO2 Tube secured with: Tape Dental Injury: Teeth and Oropharynx as per pre-operative assessment        

## 2020-11-09 NOTE — Transfer of Care (Signed)
Immediate Anesthesia Transfer of Care Note  Patient: Gen Clagg  Procedure(s) Performed: RIGHT KNEE ARTHROSCOPY WITH PARTIAL MEDIAL MENISCECTOMY (Right Knee)  Patient Location: PACU  Anesthesia Type:General  Level of Consciousness: sedated  Airway & Oxygen Therapy: Patient Spontanous Breathing and Patient connected to face mask oxygen  Post-op Assessment: Report given to RN and Post -op Vital signs reviewed and stable  Post vital signs: Reviewed and stable  Last Vitals:  Vitals Value Taken Time  BP 99/58 11/09/20 0945  Temp    Pulse 51 11/09/20 0946  Resp 12 11/09/20 0946  SpO2 100 % 11/09/20 0946  Vitals shown include unvalidated device data.  Last Pain:  Vitals:   11/09/20 0739  TempSrc: Oral  PainSc: 5       Patients Stated Pain Goal: 3 (11/09/20 0739)  Complications: No complications documented.

## 2020-11-09 NOTE — Anesthesia Preprocedure Evaluation (Addendum)
Anesthesia Evaluation  Patient identified by MRN, date of birth, ID band Patient awake    Reviewed: Allergy & Precautions, NPO status , Patient's Chart, lab work & pertinent test results  Airway Mallampati: I  TM Distance: >3 FB Neck ROM: Full    Dental no notable dental hx. (+) Teeth Intact, Dental Advisory Given   Pulmonary Current Smoker and Patient abstained from smoking.,  1/2ppd x 20 years  No inhalers   Pulmonary exam normal breath sounds clear to auscultation       Cardiovascular negative cardio ROS Normal cardiovascular exam Rhythm:Regular Rate:Normal     Neuro/Psych negative neurological ROS  negative psych ROS   GI/Hepatic negative GI ROS, Neg liver ROS,   Endo/Other  negative endocrine ROS  Renal/GU negative Renal ROS  negative genitourinary   Musculoskeletal  (+) Arthritis , Osteoarthritis,  Right knee medial meniscal tear    Abdominal   Peds negative pediatric ROS (+)  Hematology negative hematology ROS (+)   Anesthesia Other Findings   Reproductive/Obstetrics negative OB ROS                            Anesthesia Physical Anesthesia Plan  ASA: II  Anesthesia Plan: General   Post-op Pain Management:    Induction: Intravenous  PONV Risk Score and Plan: Ondansetron, Dexamethasone, Midazolam and Treatment may vary due to age or medical condition  Airway Management Planned: LMA  Additional Equipment: None  Intra-op Plan:   Post-operative Plan: Extubation in OR  Informed Consent: I have reviewed the patients History and Physical, chart, labs and discussed the procedure including the risks, benefits and alternatives for the proposed anesthesia with the patient or authorized representative who has indicated his/her understanding and acceptance.     Dental advisory given  Plan Discussed with: CRNA  Anesthesia Plan Comments:         Anesthesia Quick  Evaluation

## 2020-11-10 ENCOUNTER — Encounter (HOSPITAL_BASED_OUTPATIENT_CLINIC_OR_DEPARTMENT_OTHER): Payer: Self-pay | Admitting: Orthopaedic Surgery

## 2021-06-25 ENCOUNTER — Telehealth: Payer: Self-pay | Admitting: *Deleted

## 2021-06-25 ENCOUNTER — Other Ambulatory Visit: Payer: Self-pay

## 2021-06-25 ENCOUNTER — Emergency Department (HOSPITAL_COMMUNITY)
Admission: EM | Admit: 2021-06-25 | Discharge: 2021-06-25 | Disposition: A | Discharge status: Home / Self Care | Payer: Medicaid Other | Attending: Emergency Medicine | Admitting: Emergency Medicine

## 2021-06-25 DIAGNOSIS — G8929 Other chronic pain: Secondary | ICD-10-CM | POA: Diagnosis not present

## 2021-06-25 DIAGNOSIS — M545 Low back pain, unspecified: Secondary | ICD-10-CM | POA: Insufficient documentation

## 2021-06-25 DIAGNOSIS — M25511 Pain in right shoulder: Secondary | ICD-10-CM | POA: Insufficient documentation

## 2021-06-25 DIAGNOSIS — F17210 Nicotine dependence, cigarettes, uncomplicated: Secondary | ICD-10-CM | POA: Diagnosis not present

## 2021-06-25 DIAGNOSIS — B372 Candidiasis of skin and nail: Secondary | ICD-10-CM | POA: Diagnosis not present

## 2021-06-25 MED ORDER — FLUCONAZOLE 150 MG PO TABS
150.0000 mg | ORAL_TABLET | Freq: Once | ORAL | Status: AC
  Administered 2021-06-25: 150 mg via ORAL
  Filled 2021-06-25: qty 1

## 2021-06-25 MED ORDER — NYSTATIN 100000 UNIT/GM EX CREA: TOPICAL_CREAM | CUTANEOUS | 0 refills | Status: AC

## 2021-06-25 MED ORDER — KETOROLAC TROMETHAMINE 15 MG/ML IJ SOLN: 15.0000 mg | Freq: Once | INTRAMUSCULAR | Status: DC

## 2021-06-25 MED ORDER — KETOROLAC TROMETHAMINE 15 MG/ML IJ SOLN
15.0000 mg | Freq: Once | INTRAMUSCULAR | Status: AC
  Administered 2021-06-25: 15 mg via INTRAMUSCULAR
  Filled 2021-06-25: qty 1

## 2021-06-25 MED ORDER — MELOXICAM 15 MG PO TABS: 15.0000 mg | ORAL_TABLET | Freq: Every day | ORAL | 0 refills | Status: DC

## 2021-06-25 MED ORDER — METHOCARBAMOL 500 MG PO TABS: 500.0000 mg | ORAL_TABLET | Freq: Two times a day (BID) | ORAL | 0 refills | Status: DC

## 2021-06-25 NOTE — Discharge Instructions (Addendum)
Take the prescription Meloxicam as prescribed.  Make sure you are not operating heavy machinery or driving while taking the muscle relaxer (Robaxin).  You may apply heat to affected area for up to 15 minutes at a time.  Apply the nystatin cream to the groin area 2 times a day.  Make sure to keep area dry after showers.  The last page of the discharge paperwork will have information for primary care doctor, call today to set up new patient appointment.  Return to the Emergency Department if you are experiencing fever or worsening/persistent symptoms.

## 2021-06-25 NOTE — ED Provider Notes (Signed)
[facility] EMERGENCY DEPARTMENT Provider Note   CSN: [id number] Arrival date & time: 06/25/21  1194     History Chief Complaint  Patient presents with   Shoulder Pain   Rash   Pruritis   Back Pain    Ricardo Khan is a 45 y.o. male with a past medical history of chronic bilateral low back pain presenting to the ED with multiple complaints.  Patient has right shoulder pain onset 6 months.  When he turns his head to the right there is a pulling sensation with localized burning sensation to his right shoulder that has been ongoing for 6 months.  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  Patient has tried prescription Mobic, Tylenol, ibuprofen with relief of his symptoms.  Patient denies numbness, tingling, weakness, neck stiffness, fever, chills.  He denies injury or trauma.  Patient is right-hand dominant.  Patient also reports low back pain that is chronic in nature.  He has a history of chronic bilateral low back pain.  Patient has been evaluated by neurologist out of town and received steroid injections with relief of his back pain. Patient denies injury or trauma.  He has tried Tylenol, ibuprofen with relief of his symptoms.  He denies fever, chills, abdominal pain, nausea, vomiting.  Patient has pruritus to the groin region onset 2-3 months.  Patient reports that he does dry completely following a shower however has noticed an increase in moisture to the area.  He has tried over-the-counter hydrocortisone cream with relief of his pruritus.  He denies pain, drainage, redness, penile pain/swelling, testicular pain/swelling, abdominal pain, nausea, or vomiting.  The history is provided by the patient. No language interpreter was used.      No past medical history on file.  Patient Active Problem List   Diagnosis Date Noted   Complex tear of medial meniscus of right knee 11/08/2020   Unilateral primary osteoarthritis,  right knee 02/16/2020   Chronic pain of left knee 06/11/2017   Chronic bilateral low back pain without sciatica 06/11/2017    Past Surgical History:  Procedure Laterality Date   HERNIA REPAIR     KNEE ARTHROSCOPY Right 11/09/2020   Procedure: RIGHT KNEE ARTHROSCOPY WITH PARTIAL MEDIAL MENISCECTOMY;  Surgeon: Kathryne Hitch, MD;  Location: [facility];  Service: Orthopedics;  Laterality: Right;   MOUTH SURGERY         Family History  Problem Relation Age of Onset   Healthy Mother    Healthy Father     Social History   Tobacco Use   Smoking status: Every Day    Packs/day: 0.50    Types: Cigarettes   Smokeless tobacco: Never  Substance Use Topics   Alcohol use: Yes    Comment: occ   Drug use: Never    Home Medications Prior to Admission medications   Medication Sig Start Date End Date Taking? Authorizing Provider  methocarbamol (ROBAXIN) 500 MG tablet Take 1 tablet (500 mg total) by mouth 2 (two) times daily. 06/25/21  Yes Kelvon Giannini A, PA-C  nystatin cream (MYCOSTATIN) Apply to affected area 2 times daily 06/25/21  Yes Margueritte Guthridge A, PA-C  HYDROcodone-acetaminophen (NORCO) 5-325 MG tablet Take 1-2 tablets by mouth every 6 (six) hours as needed for moderate pain. 11/09/20   Kathryne Hitch, MD  meloxicam (MOBIC) 15 MG tablet Take 1 tablet (15 mg total) by mouth daily. 06/25/21   Chandra Feger A, PA-C  Allergies    Patient has no known allergies.  Review of Systems   Review of Systems  Constitutional:  Negative for chills and fever.  Respiratory:  Negative for shortness of breath.   Cardiovascular:  Negative for chest pain.  Gastrointestinal:  Negative for abdominal pain, nausea and vomiting.  Musculoskeletal:  Positive for arthralgias and back pain.  Skin:  Positive for rash. Negative for color change.  Neurological:  Negative for dizziness and light-headedness.  All other systems reviewed and are negative.  Physical  Exam Updated Vital Signs BP (!) 138/96 (BP Location: Left Arm)   Pulse 84   Temp 98 F (36.7 C)   Resp 16   SpO2 100%   Physical Exam Vitals and nursing note reviewed. Exam conducted with a chaperone present.  Constitutional:      General: He is not in acute distress.    Appearance: He is not diaphoretic.  HENT:     Head: Normocephalic and atraumatic.     Nose: Nose normal.     Mouth/Throat:     Mouth: Mucous membranes are moist.     Pharynx: Oropharynx is clear. No oropharyngeal exudate.  Eyes:     General: No scleral icterus.    Conjunctiva/sclera: Conjunctivae normal.  Cardiovascular:     Rate and Rhythm: Normal rate and regular rhythm.     Pulses: Normal pulses.     Heart sounds: Normal heart sounds.  Pulmonary:     Effort: Pulmonary effort is normal. No respiratory distress.     Breath sounds: Normal breath sounds. No wheezing.  Abdominal:     General: Bowel sounds are normal.     Palpations: Abdomen is soft. There is no mass.     Tenderness: There is no abdominal tenderness. There is no guarding or rebound.  Genitourinary:    Pubic Area: Rash present.     Penis: Normal and circumcised. No tenderness, discharge or swelling.      Testes: Normal.        Right: Mass, tenderness or swelling not present.        Left: Mass, tenderness or swelling not present.     Comments: RN chaperone present for exam.  Well demarcated, dry regions noted to bilateral inguinal area without surrounding erythema or drainage.  No penile swelling or discharge.  No inguinal adenopathy appreciated on exam. Musculoskeletal:        General: Normal range of motion.     Right shoulder: Tenderness present. No swelling, deformity or bony tenderness. Normal range of motion. Normal pulse.     Left shoulder: Normal. No swelling, deformity, tenderness or bony tenderness. Normal range of motion. Normal pulse.       Arms:     Cervical back: Normal range of motion and neck supple. Tenderness present. No  swelling, edema, deformity, erythema, signs of trauma, torticollis, bony tenderness or crepitus. No pain with movement. Normal range of motion.     Thoracic back: Normal.     Lumbar back: Tenderness present. No swelling, edema, deformity, lacerations or bony tenderness. Normal range of motion. Negative right straight leg raise test and negative left straight leg raise test.     Comments: Right shoulder with tenderness to palpation to anterolateral aspect. No tenderness to palpation to biceps tendon.  Full active range of motion of right shoulder. Minimal tenderness to palpation to right lateral neck.   Full active range of motion of neck.  No obvious deformity, swelling, crepitus of right shoulder.  Radial pulses  intact bilaterally.    Tenderness to palpation to lumbar paraspinal region.  No obvious deformity, ecchymosis, erythema, step-offs.  Full active range of motion of back.  No C, T, L, S spinal tenderness.  Negative straight leg raise bilaterally.  DP and PT pulses intact bilaterally.  Able to ambulate without assistance or difficulty.  Strength and sensation intact to bilateral upper and lower extremities.  Skin:    General: Skin is warm and dry.     Capillary Refill: Capillary refill takes less than 2 seconds.     Findings: Rash present. No abscess, ecchymosis or erythema. Rash is not crusting.  Neurological:     Mental Status: He is alert.  Psychiatric:        Behavior: Behavior normal.    ED Results / Procedures / Treatments   Labs (all labs ordered are listed, but only abnormal results are displayed) Labs Reviewed - No data to display  EKG None  Radiology No results found.  Procedures Procedures   Medications Ordered in ED Medications  fluconazole (DIFLUCAN) tablet 150 mg (150 mg Oral Given 06/25/21 1054)  ketorolac (TORADOL) 15 MG/ML injection 15 mg (15 mg Intramuscular Given 06/25/21 1054)    ED Course  I have reviewed the triage vital signs and the nursing  notes.  Pertinent labs & imaging results that were available during my care of the patient were reviewed by me and considered in my medical decision making (see chart for details).  Clinical Course as of 06/25/21 1313  Mon Jun 25, 2021  1031 Discussed patient case with PA-C colleague, Laveda Norman, PA-C evaluated patient in the ED.  Agrees with treatment plan. [SB]  1132 Patient reevaluated prior to discharge.  Patient resting comfortably on stretcher.  Patient actively moving right shoulder and back without pain or difficulty.  Patient reports minimal improvement of pain.  Discussed discharge treatment plan with patient.  Patient agreeable to discharge treatment plan. [SB]    Clinical Course User Index [SB] Fareeha Evon A, PA-C   MDM Rules/Calculators/A&P                          Patient presents to the ED with multiple complaints.  Patient with right shoulder pain onset 6 months.  Patient works multiple labor-intensive jobs.  Denies recent injury or trauma.  On exam patient with full active range of motion of right shoulder, no obvious deformities, erythema, ecchymosis or step-offs.  No spinal tenderness.  Differential diagnosis includes rotator cuff pathology or subclavian steal syndrome.  Less likely subclavian steal syndrome patient without exercise-induced pain, coolness, paresthesias, or numbness to right arm. Normal neurological exam, less likely vessel occlusion. Denies weakness, dizziness, or lightheadedness.  This is likely musculoskeletal in nature.  Will give Toradol injection in the ED. Minimal improvement of pain with Toradol injection.  Patient also with chronic lower back pain without sciatica.  Patient was previously followed by neurology and given steroid injections due to herniated disc in [place], no follow-up locally.  Negative straight leg raise bilaterally.  Pulses intact.  Able to ambulate without difficulty or assistance.  Will provide resources for primary care provider for  chronic lower back pain management.  Will prescribe Mobic and Robaxin.  Discussed with patient to not operate heavy machinery or drive while taking muscle relaxer.  Patient acknowledges and verbalizes understanding.  Patient also with inguinal rash.  On exam patient with yeast appearing rash to bilateral inguinal folds.  This is likely superficial  Candidiasis of the skin folds.  Given one-time dose of fluconazole in the ED.  Will prescribe nystatin cream.  Discussed with patient's importance of drying completely.  Patient knowledges and verbalized understanding.  Discussed with patient importance of establishing primary care locally for further management of chronic concerns.  Strict return precautions and supportive measures discussed with patient.  Patient acknowledges and verbalizes understanding.  Follow-up as indicated in discharge paperwork.   Final Clinical Impression(s) / ED Diagnoses Final diagnoses:  Chronic bilateral low back pain without sciatica  Chronic right shoulder pain  Yeast dermatitis    Rx / DC Orders ED Discharge Orders          Ordered    nystatin cream (MYCOSTATIN)        06/25/21 1137    meloxicam (MOBIC) 15 MG tablet  Daily        06/25/21 1137    methocarbamol (ROBAXIN) 500 MG tablet  2 times daily        06/25/21 1137             Avya Flavell A, PA-C 06/25/21 1313    Mancel Bale, MD 06/25/21 1734

## 2021-06-25 NOTE — ED Triage Notes (Signed)
Pt. Stated, Im having lower back pain cause they are herniated, I have rt. Shoulder pain and im itching in between my upper thighs.

## 2021-06-25 NOTE — Telephone Encounter (Signed)
Pharmacy called regarding [facility] not being on Medicaid approved provider list and asked for permission to utilize Dr Eber Hong as written on Rx.

## 2021-11-20 IMAGING — CR DG KNEE COMPLETE 4+V*R*
4 series · 4 of 4 positions shown · non-contrast
Comparison: None.

CLINICAL DATA: Right knee pain for 2 weeks, swelling, no injury.

EXAM:
RIGHT KNEE - COMPLETE 4+ VIEW

[knee ap]
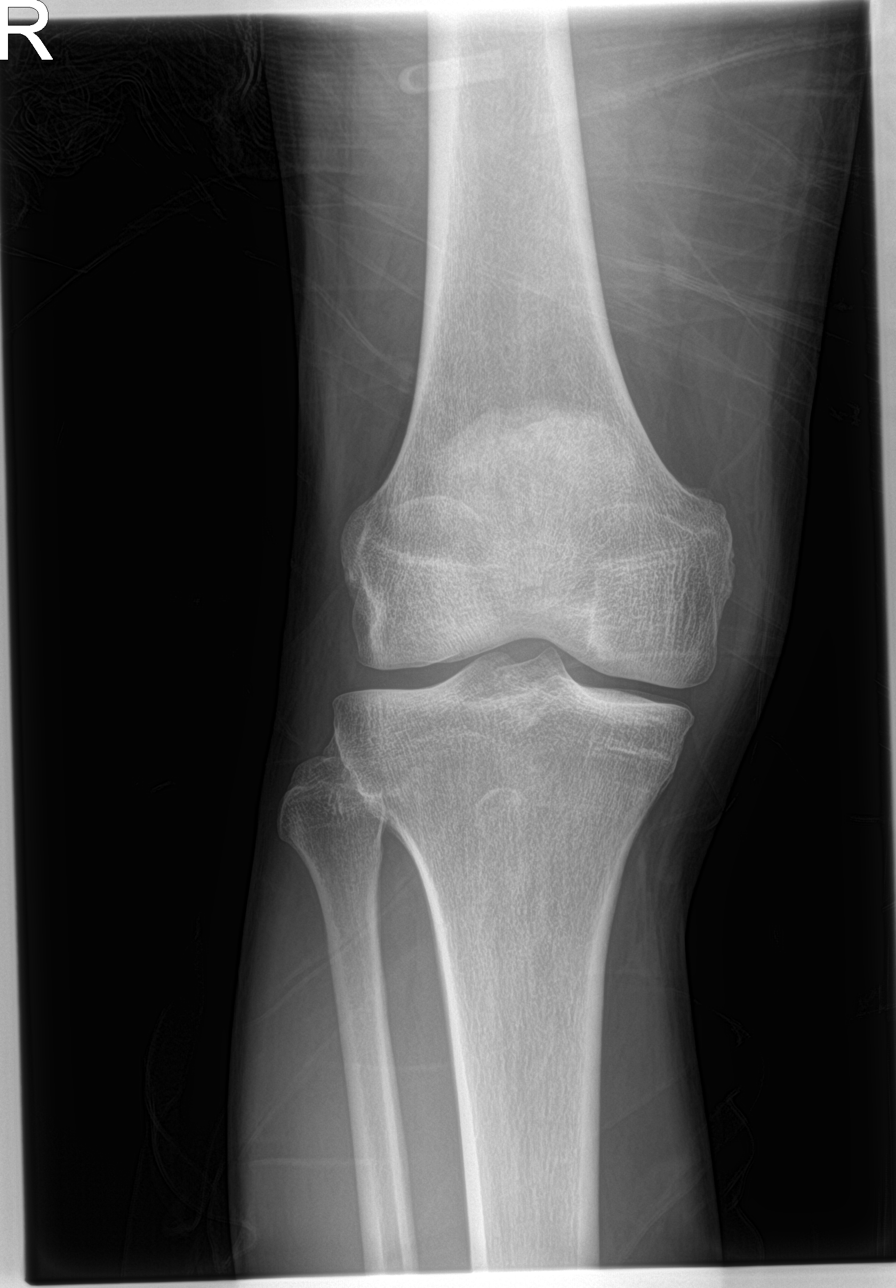

[knee lat]
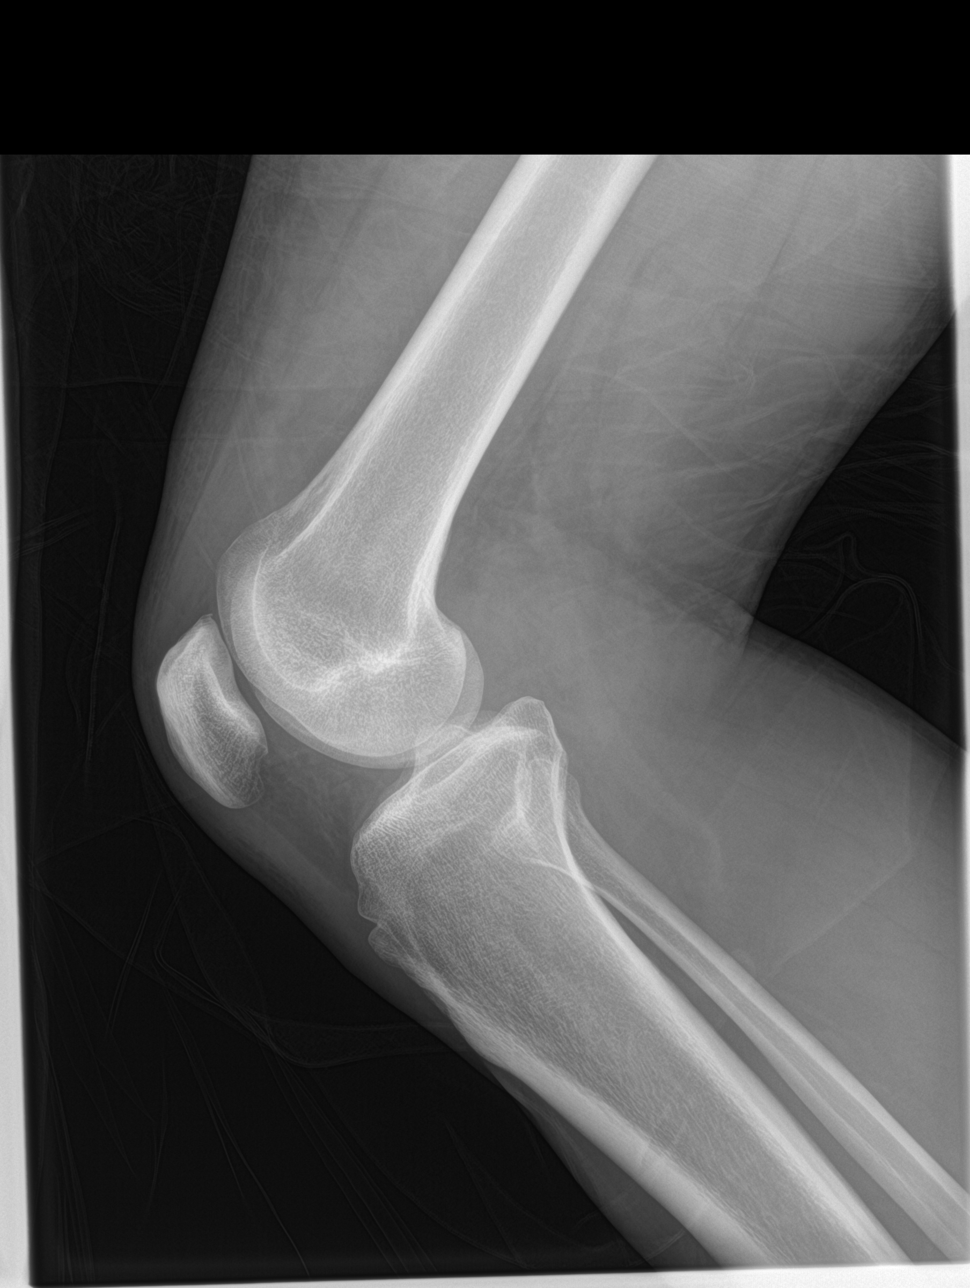

[knee obl (1 of 2)]
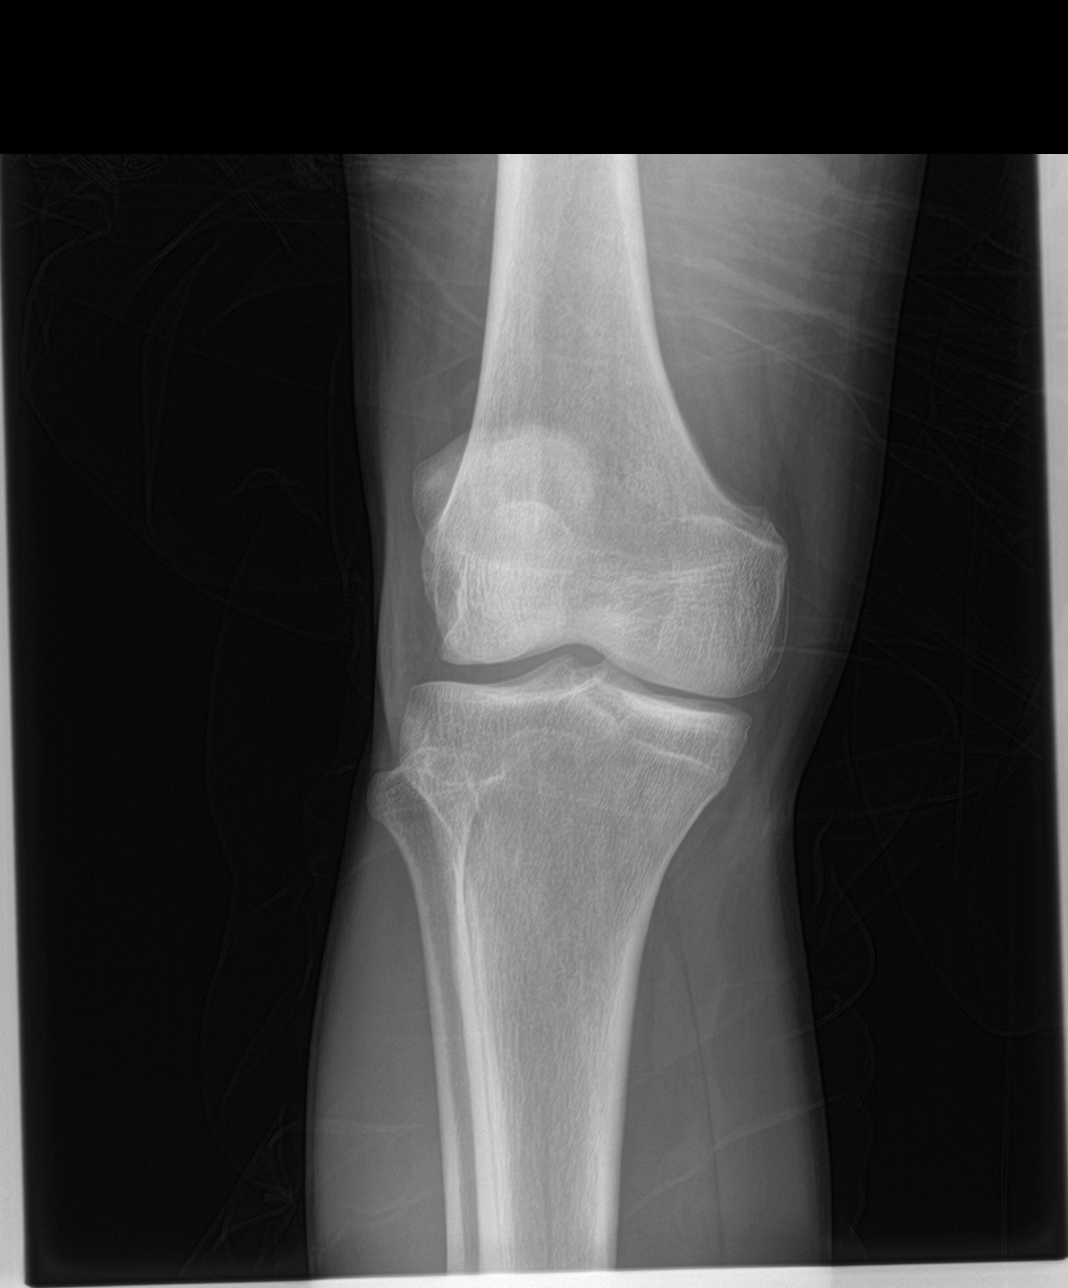

[knee obl (2 of 2)]
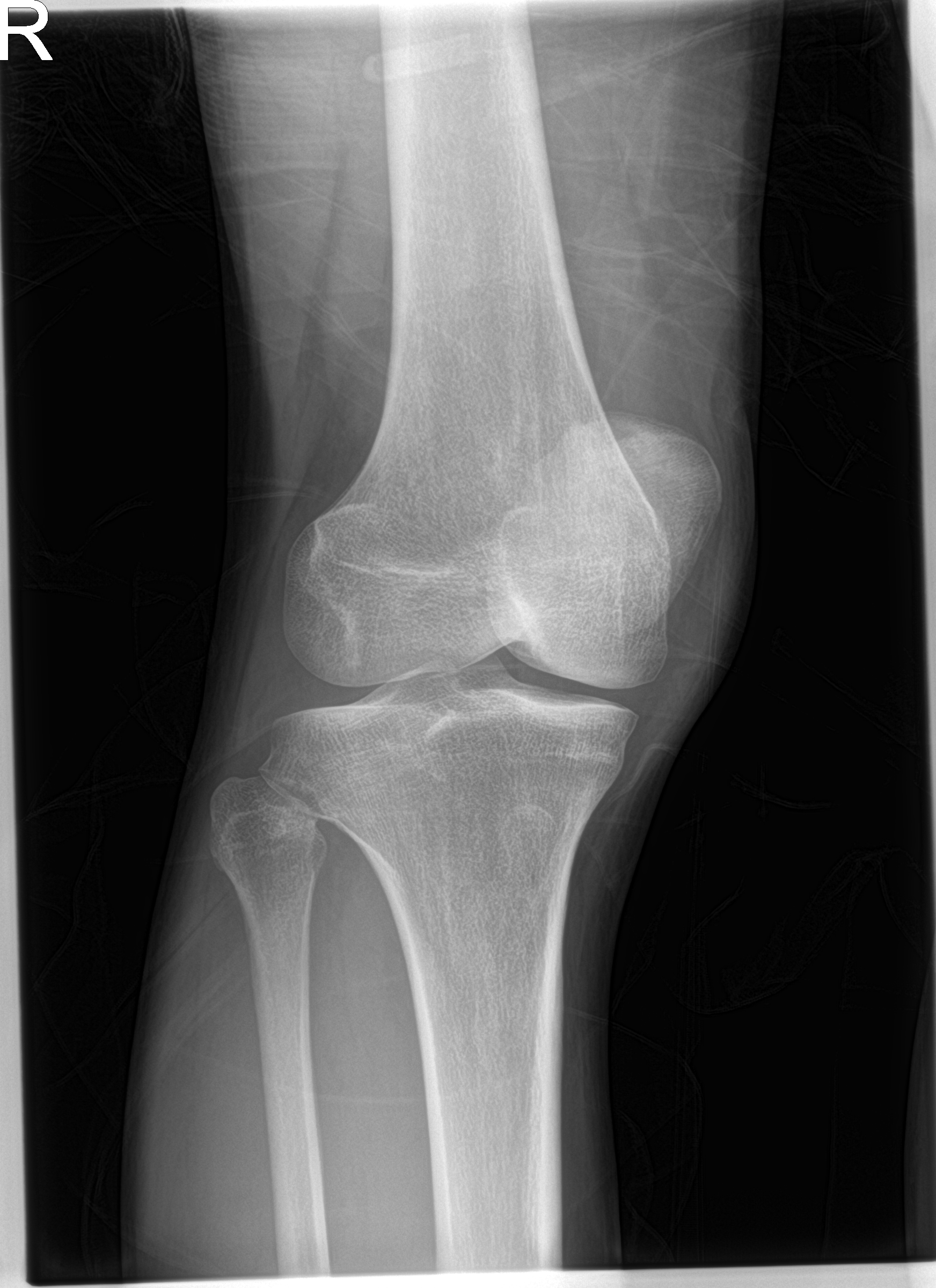

[4 of 4 positions shown; findings below may reference images not displayed]

FINDINGS: No joint effusion or acute osseous abnormality. No degenerative
changes.
IMPRESSION: No acute or degenerative findings.

## 2022-04-29 ENCOUNTER — Emergency Department (HOSPITAL_COMMUNITY): Payer: Medicaid Other

## 2022-04-29 ENCOUNTER — Other Ambulatory Visit: Payer: Self-pay

## 2022-04-29 ENCOUNTER — Encounter (HOSPITAL_COMMUNITY): Payer: Self-pay

## 2022-04-29 ENCOUNTER — Emergency Department (HOSPITAL_COMMUNITY)
Admission: EM | Admit: 2022-04-29 | Discharge: 2022-04-30 | Discharge status: Against Medical Advice | Payer: Medicaid Other | Attending: Physician Assistant | Admitting: Physician Assistant

## 2022-04-29 DIAGNOSIS — M25511 Pain in right shoulder: Secondary | ICD-10-CM | POA: Insufficient documentation

## 2022-04-29 DIAGNOSIS — Z5321 Procedure and treatment not carried out due to patient leaving prior to being seen by health care provider: Secondary | ICD-10-CM | POA: Diagnosis not present

## 2022-04-29 DIAGNOSIS — M545 Low back pain, unspecified: Secondary | ICD-10-CM | POA: Insufficient documentation

## 2022-04-29 MED ORDER — OXYCODONE-ACETAMINOPHEN 5-325 MG PO TABS
1.0000 | ORAL_TABLET | Freq: Once | ORAL | Status: AC
  Administered 2022-04-29: 1 via ORAL
  Filled 2022-04-29: qty 1
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
## 2022-04-30 ENCOUNTER — Emergency Department (HOSPITAL_COMMUNITY): Payer: Medicaid Other

## 2022-04-30 NOTE — ED Notes (Addendum)
Pt came up yelling said he don't need a room for results. Pt said go find him a room now so he get his results. Pt explained he will get a room soon as we can

## 2023-01-10 ENCOUNTER — Encounter (HOSPITAL_COMMUNITY): Payer: Self-pay

## 2023-01-10 ENCOUNTER — Ambulatory Visit (HOSPITAL_COMMUNITY)
Admission: EM | Admit: 2023-01-10 | Discharge: 2023-01-10 | Disposition: A | Discharge status: Home / Self Care | Payer: Medicaid Other | Attending: Urgent Care | Admitting: Urgent Care

## 2023-01-10 DIAGNOSIS — K029 Dental caries, unspecified: Secondary | ICD-10-CM | POA: Diagnosis not present

## 2023-01-10 DIAGNOSIS — K047 Periapical abscess without sinus: Secondary | ICD-10-CM | POA: Diagnosis not present

## 2023-01-10 MED ORDER — IBUPROFEN 800 MG PO TABS: 800.0000 mg | ORAL_TABLET | Freq: Three times a day (TID) | ORAL | 0 refills | Status: DC

## 2023-01-10 MED ORDER — HYDROCODONE-ACETAMINOPHEN 5-325 MG PO TABS: 1.0000 | ORAL_TABLET | Freq: Four times a day (QID) | ORAL | 0 refills | Status: DC | PRN

## 2023-01-10 MED ORDER — AMOXICILLIN-POT CLAVULANATE 875-125 MG PO TABS: 1.0000 | ORAL_TABLET | Freq: Two times a day (BID) | ORAL | 0 refills | Status: AC

## 2023-01-10 NOTE — ED Triage Notes (Signed)
Pt is here for dental pain that started 2 days ago.

## 2023-01-10 NOTE — Discharge Instructions (Addendum)
Please start taking the antibiotic prescribed.  This will help prevent any worsening infection. Start taking the medication for the pain as prescribed.  Use only for severe pain.  Side effects include constipation and drowsiness. For milder pain, you may take the 800 mg of ibuprofen with food every 8 hours as needed. Do NOT take your mobic (meloxicam) while on this NSAID. STOP >48 hours prior to your procedure on Monday. Please follow-up with a dentist as soon as possible for further evaluation.

## 2023-01-10 NOTE — ED Provider Notes (Signed)
[facility]    CSN: [id number] Arrival date & time: 01/10/23  1914      History   Chief Complaint Chief Complaint  Patient presents with   Dental Pain    HPI Ricardo Khan is a 47 y.o. male.   Pleasant 47 year old male presents today due to concerns of dental pain.  He states that symptoms started just yesterday, much worse today.  He is concerned about swelling of the lymph nodes in his neck on the left side.  He states he was chewing something and believes he cracked a filling.  The pain is to the bottom left back most molar.  He feels that the gums surrounding it are swollen and white.  He denies a fever.  He contacted his dentist and states he cannot get in until next week.  Denies a fever or facial swelling.   Dental Pain   History reviewed. No pertinent past medical history.  Patient Active Problem List   Diagnosis Date Noted   Complex tear of medial meniscus of right knee 11/08/2020   Unilateral primary osteoarthritis, right knee 02/16/2020   Chronic pain of left knee 06/11/2017   Chronic bilateral low back pain without sciatica 06/11/2017    Past Surgical History:  Procedure Laterality Date   HERNIA REPAIR     KNEE ARTHROSCOPY Right 11/09/2020   Procedure: RIGHT KNEE ARTHROSCOPY WITH PARTIAL MEDIAL MENISCECTOMY;  Surgeon: Kathryne Hitch, MD;  Location: [facility];  Service: Orthopedics;  Laterality: Right;   MOUTH SURGERY         Home Medications    Prior to Admission medications   Medication Sig Start Date End Date Taking? Authorizing Provider  amoxicillin-clavulanate (AUGMENTIN) 875-125 MG tablet Take 1 tablet by mouth every 12 (twelve) hours for 10 days. 01/10/23 01/20/23 Yes Winslow Ederer L, PA  ibuprofen (ADVIL) 800 MG tablet Take 1 tablet (800 mg total) by mouth 3 (three) times daily. Take with food 01/10/23  Yes Kreg Earhart L, PA  methocarbamol (ROBAXIN) 500 MG tablet Take 1 tablet (500 mg total) by mouth 2 (two) times  daily. 06/25/21  Yes Blue, Soijett A, PA-C  nystatin cream (MYCOSTATIN) Apply to affected area 2 times daily 06/25/21  Yes Blue, Soijett A, PA-C  HYDROcodone-acetaminophen (NORCO) 5-325 MG tablet Take 1 tablet by mouth every 6 (six) hours as needed for up to 12 doses for severe pain. 01/10/23   Maretta Bees, PA    Family History Family History  Problem Relation Age of Onset   Healthy Mother    Healthy Father     Social History Social History   Tobacco Use   Smoking status: Every Day    Packs/day: .5    Types: Cigarettes   Smokeless tobacco: Never  Substance Use Topics   Alcohol use: Yes    Comment: occ   Drug use: Yes    Types: Marijuana     Allergies   Patient has no known allergies.   Review of Systems Review of Systems As per HPI  Physical Exam Triage Vital Signs ED Triage Vitals [01/10/23 0920]  Enc Vitals Group     BP (!) 145/81     Pulse Rate (!) 57     Resp 18     Temp 98.9 F (37.2 C)     Temp Source Oral     SpO2 97 %     Weight      Height      Head Circumference  Peak Flow      Pain Score      Pain Loc      Pain Edu?      Excl. in GC?    No data found.  Updated Vital Signs BP (!) 145/81 (BP Location: Left Arm)   Pulse (!) 57   Temp 98.9 F (37.2 C) (Oral)   Resp 18   SpO2 97%   Visual Acuity Right Eye Distance:   Left Eye Distance:   Bilateral Distance:    Right Eye Near:   Left Eye Near:    Bilateral Near:     Physical Exam Vitals and nursing note reviewed.  Constitutional:      General: He is not in acute distress.    Appearance: Normal appearance. He is not ill-appearing, toxic-appearing or diaphoretic.  HENT:     Head: Normocephalic and atraumatic.     Nose: No congestion.     Mouth/Throat:     Lips: Pink.     Mouth: Mucous membranes are moist. No oral lesions.     Dentition: Abnormal dentition. Dental tenderness, gingival swelling, dental caries and dental abscesses present.     Tongue: No lesions.      Palate: No mass and lesions.     Pharynx: Oropharynx is clear. Uvula midline. No pharyngeal swelling, oropharyngeal exudate, posterior oropharyngeal erythema or uvula swelling.   Cardiovascular:     Rate and Rhythm: Bradycardia present.  Pulmonary:     Effort: Pulmonary effort is normal. No respiratory distress.  Musculoskeletal:     Cervical back: Normal range of motion and neck supple. No rigidity or tenderness.  Lymphadenopathy:     Cervical: Cervical adenopathy (L submandibular and anterior cervical) present.  Skin:    General: Skin is warm and dry.     Coloration: Skin is not jaundiced.     Findings: No bruising, erythema or rash.  Neurological:     General: No focal deficit present.     Mental Status: He is alert and oriented to person, place, and time.      UC Treatments / Results  Labs (all labs ordered are listed, but only abnormal results are displayed) Labs Reviewed - No data to display  EKG   Radiology No results found.  Procedures Procedures (including critical care time)  Medications Ordered in UC Medications - No data to display  Initial Impression / Assessment and Plan / UC Course  I have reviewed the triage vital signs and the nursing notes.  Pertinent labs & imaging results that were available during my care of the patient were reviewed by me and considered in my medical decision making (see chart for details).     Dental abscess -will start Augmentin twice daily for the next 10 days.  Patient has follow-up scheduled with his dentist sometime next week.  He is hoping to get his tooth extracted prior to that.  Will give patient hydrocodone to use for severe pain.  PDMP was reviewed, no abuse noted.  Will give ibuprofen for less severe pain. Dental caries -will need to follow-up with dentist for further evaluation.  Cessation of smoking would help with his dental health.  Final Clinical Impressions(s) / UC Diagnoses   Final diagnoses:  Dental caries   Dental abscess     Discharge Instructions      Please start taking the antibiotic prescribed.  This will help prevent any worsening infection. Start taking the medication for the pain as prescribed.  Use only for severe  pain.  Side effects include constipation and drowsiness. For milder pain, you may take the 800 mg of ibuprofen with food every 8 hours as needed. Do NOT take your mobic (meloxicam) while on this NSAID. STOP >48 hours prior to your procedure on Monday. Please follow-up with a dentist as soon as possible for further evaluation.      ED Prescriptions     Medication Sig Dispense Auth. Provider   HYDROcodone-acetaminophen (NORCO) 5-325 MG tablet Take 1 tablet by mouth every 6 (six) hours as needed for up to 12 doses for severe pain. 12 tablet Monteen Toops L, PA   ibuprofen (ADVIL) 800 MG tablet Take 1 tablet (800 mg total) by mouth 3 (three) times daily. Take with food 15 tablet Neiva Maenza L, PA   amoxicillin-clavulanate (AUGMENTIN) 875-125 MG tablet Take 1 tablet by mouth every 12 (twelve) hours for 10 days. 20 tablet Brookes Craine L, [place]      I have reviewed the PDMP during this encounter.   Maretta Bees, [place] 01/10/23 [phone]

## 2023-07-29 ENCOUNTER — Encounter (HOSPITAL_COMMUNITY): Payer: Self-pay

## 2023-07-29 ENCOUNTER — Ambulatory Visit (INDEPENDENT_AMBULATORY_CARE_PROVIDER_SITE_OTHER): Payer: Medicaid Other

## 2023-07-29 ENCOUNTER — Ambulatory Visit (HOSPITAL_COMMUNITY)
Admission: EM | Admit: 2023-07-29 | Discharge: 2023-07-29 | Disposition: A | Discharge status: Home / Self Care | Payer: Medicaid Other | Attending: Emergency Medicine | Admitting: Emergency Medicine

## 2023-07-29 DIAGNOSIS — M542 Cervicalgia: Secondary | ICD-10-CM

## 2023-07-29 DIAGNOSIS — M545 Low back pain, unspecified: Secondary | ICD-10-CM

## 2023-07-29 DIAGNOSIS — M7918 Myalgia, other site: Secondary | ICD-10-CM | POA: Diagnosis not present

## 2023-07-29 DIAGNOSIS — S161XXA Strain of muscle, fascia and tendon at neck level, initial encounter: Secondary | ICD-10-CM

## 2023-07-29 MED ORDER — KETOROLAC TROMETHAMINE 30 MG/ML IJ SOLN
INTRAMUSCULAR | Status: AC
  Filled 2023-07-29: qty 1

## 2023-07-29 MED ORDER — BACLOFEN 10 MG PO TABS: 10.0000 mg | ORAL_TABLET | Freq: Three times a day (TID) | ORAL | 0 refills | Status: AC

## 2023-07-29 MED ORDER — KETOROLAC TROMETHAMINE 30 MG/ML IJ SOLN
30.0000 mg | Freq: Once | INTRAMUSCULAR | Status: AC
  Administered 2023-07-29: 30 mg via INTRAMUSCULAR

## 2023-07-29 MED ORDER — MELOXICAM 15 MG PO TABS: 15.0000 mg | ORAL_TABLET | Freq: Every day | ORAL | 0 refills | Status: AC
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
## 2023-07-29 NOTE — Discharge Instructions (Addendum)
The final report of the imaging of your neck and lumbar spine will be available this evening.  We will contact you with those results once we receive them.  Please be advised that we close a 4 PM today and will be closed tomorrow so it may be Thursday before we reach you.  Per my personal read of your x-rays, I do not see any acute bony injury.  I do see that you suffer from arthritis and disc herniation, as we discussed.  The mainstay of therapy for musculoskeletal pain is reduction of inflammation and relaxation of tension which is causing inflammation.  Keep in mind, pain always begets more pain.  To help you stay ahead of your pain and inflammation, I have provided the following regimen for you:   During your visit today, you received an injection of ketorolac, high-dose nonsteroidal anti-inflammatory pain medication that should significantly reduce your pain for the next 6 to 8 hours.   When you pick up your prescription from the pharmacy, you can begin taking baclofen 10 mg.  This is a highly effective muscle relaxer and antispasmodic which should continue to provide you with relaxation of your tense muscles, allow you to sleep well and to keep your pain under control.  You can continue taking this medication 3 times daily as you need to.  If you find that this medication makes you too sleepy, you can break them in half for your daytime doses and, if needed double them for your nighttime dose.  Do not take more than 30 mg of baclofen in a 24-hour period.  This evening, please begin taking meloxicam 15 mg once daily.  This is a nonsteroidal anti-inflammatory pain medication that works similarly to ibuprofen and naproxen.  Please do not take either of these medications while taking meloxicam.     During the day, please set aside time to apply ice to the affected area 4 times daily for 20 minutes each application.  This can be achieved by using a bag of frozen peas or corn, a Ziploc bag filled with ice  and water, or Ziploc bag filled with half rubbing alcohol and half Dawn dish detergent, frozen into a slush.  Please be careful not to apply ice directly to your skin, always place a soft cloth between you and the ice pack.  Over-the-counter products such as IcyHot and Biofreeze do not work nearly as well.   Please avoid attempts to stretch or strengthen the affected area until you are feeling completely pain-free.  Attempts to do so will only prolong the healing process.   I also recommend that you remain out of work for the next several days, I provided you with a note to return to work in 3 days.  If you feel that you need this time extended, please follow-up with your primary care provider or return to urgent care for reevaluation so that we can provide you with a note for another 3 days.   Thank you for visiting [facility] today.  We appreciate the opportunity to participate in your care.
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
## 2024-02-18 ENCOUNTER — Ambulatory Visit (HOSPITAL_COMMUNITY): Admission: EM | Admit: 2024-02-18 | Discharge: 2024-02-18 | Disposition: A | Discharge status: Home / Self Care

## 2024-02-18 ENCOUNTER — Encounter (HOSPITAL_COMMUNITY): Payer: Self-pay | Admitting: *Deleted

## 2024-02-18 DIAGNOSIS — G8929 Other chronic pain: Secondary | ICD-10-CM | POA: Diagnosis not present

## 2024-02-18 DIAGNOSIS — M545 Low back pain, unspecified: Secondary | ICD-10-CM | POA: Diagnosis not present

## 2024-02-18 HISTORY — DX: Other chronic pain: G89.29

## 2024-02-18 MED ORDER — CYCLOBENZAPRINE HCL 10 MG PO TABS: 10.0000 mg | ORAL_TABLET | Freq: Three times a day (TID) | ORAL | 0 refills | Status: DC | PRN

## 2024-02-18 MED ORDER — KETOROLAC TROMETHAMINE 30 MG/ML IJ SOLN
30.0000 mg | Freq: Once | INTRAMUSCULAR | Status: AC
  Administered 2024-02-18: 30 mg via INTRAMUSCULAR

## 2024-02-18 MED ORDER — PREDNISONE 20 MG PO TABS: 40.0000 mg | ORAL_TABLET | Freq: Every day | ORAL | 0 refills | Status: AC

## 2024-02-18 MED ORDER — DEXAMETHASONE SODIUM PHOSPHATE 10 MG/ML IJ SOLN
INTRAMUSCULAR | Status: AC
  Filled 2024-02-18: qty 1

## 2024-02-18 MED ORDER — KETOROLAC TROMETHAMINE 30 MG/ML IJ SOLN
INTRAMUSCULAR | Status: AC
  Filled 2024-02-18: qty 1

## 2024-02-18 MED ORDER — DEXAMETHASONE SODIUM PHOSPHATE 10 MG/ML IJ SOLN
10.0000 mg | Freq: Once | INTRAMUSCULAR | Status: AC
  Administered 2024-02-18: 10 mg via INTRAMUSCULAR

## 2024-02-18 NOTE — Discharge Instructions (Addendum)
 Chronic back pain with acute exacerbation after physical therapy today.  We will try to control the pain with the following and if symptoms do not improve then we will need to go to the emergency room for further evaluation: Toradol  injection given today. This is a medication to help with pain. This is not a narcotic.  Decadron  injection given today. This is a steroid to help with inflammation and pain. Prednisone  40 mg (2 tablets) once daily for 5 days. Take this in the morning.  This is a steroid to help with inflammation and pain. Flexeril  10 mg every 8 hours as needed for muscle spasms.  Use caution as this medication can cause drowsiness.  If symptoms do not improve then go to the emergency room. Schedule a follow up as soon as possible with your Spine Specialist.

## 2024-02-18 NOTE — ED Triage Notes (Signed)
 C/O flare-up of low back pain. Reports inability to continue with PT exercises this afternoon due to pain. Pt reports having episodes of jerking muscular movements transiently x2.5 yrs.

## 2024-02-18 NOTE — ED Provider Notes (Signed)
 [facility]    CSN: [id number] Arrival date & time: 02/18/24  1748      History   Chief Complaint Chief Complaint  Patient presents with   Back Pain    HPI Ricardo Khan is a 48 y.o. male.   48 year old male who presents urgent care with acute exacerbation of his chronic back pain.  The patient has been following with a spine specialist for quite some time now.  He has been having radiofrequency ablations as well as epidurals.  He was at physical therapy today and felt his pain had gotten more extreme than usual.  He denies any bowel or bladder incontinence.  He reports frustration with his spine specialist as he feels it has been too long in between his epidural injections which she states that the only thing that really helps him.  He has medication that he takes for pain as well as a muscle relaxer but does not feel that this muscle relaxer is working.   Back Pain Associated symptoms: no abdominal pain, no chest pain, no dysuria and no fever     Past Medical History:  Diagnosis Date   Chronic back pain     Patient Active Problem List   Diagnosis Date Noted   Complex tear of medial meniscus of right knee 11/08/2020   Unilateral primary osteoarthritis, right knee 02/16/2020   Chronic pain of left knee 06/11/2017   Chronic bilateral low back pain without sciatica 06/11/2017    Past Surgical History:  Procedure Laterality Date   HERNIA REPAIR     KNEE ARTHROSCOPY Right 11/09/2020   Procedure: RIGHT KNEE ARTHROSCOPY WITH PARTIAL MEDIAL MENISCECTOMY;  Surgeon: Vernetta Lonni GRADE, MD;  Location: [facility];  Service: Orthopedics;  Laterality: Right;   MOUTH SURGERY         Home Medications    Prior to Admission medications   Medication Sig Start Date End Date Taking? Authorizing Provider  baclofen  (LIORESAL ) 10 MG tablet Take 10 mg by mouth. 01/08/24 04/07/24 Yes [provider]  nystatin  cream (MYCOSTATIN ) Apply to affected area 2  times daily 06/25/21   Blue, Soijett A, PA-C    Family History Family History  Problem Relation Age of Onset   Healthy Mother    Healthy Father     Social History Social History   Tobacco Use   Smoking status: Every Day    Current packs/day: 0.50    Types: Cigarettes   Smokeless tobacco: Never  Vaping Use   Vaping status: Some Days  Substance Use Topics   Alcohol use: Not Currently   Drug use: Never     Allergies   Patient has no known allergies.   Review of Systems Review of Systems  Constitutional:  Negative for chills and fever.  HENT:  Negative for ear pain and sore throat.   Eyes:  Negative for pain and visual disturbance.  Respiratory:  Negative for cough and shortness of breath.   Cardiovascular:  Negative for chest pain and palpitations.  Gastrointestinal:  Negative for abdominal pain and vomiting.  Genitourinary:  Negative for dysuria and hematuria.  Musculoskeletal:  Positive for back pain. Negative for arthralgias.  Skin:  Negative for color change and rash.  Neurological:  Negative for seizures and syncope.  All other systems reviewed and are negative.    Physical Exam Triage Vital Signs ED Triage Vitals  Encounter Vitals Group     BP 02/18/24 1851 (!) 155/106     Girls Systolic  BP Percentile --      Girls Diastolic BP Percentile --      Boys Systolic BP Percentile --      Boys Diastolic BP Percentile --      Pulse Rate 02/18/24 1851 76     Resp 02/18/24 1851 16     Temp 02/18/24 1850 98.5 F (36.9 C)     Temp Source 02/18/24 1851 Oral     SpO2 02/18/24 1851 98 %     Weight --      Height --      Head Circumference --      Peak Flow --      Pain Score 02/18/24 1852 8     Pain Loc --      Pain Education --      Exclude from Growth Chart --    No data found.  Updated Vital Signs BP (!) 155/106   Pulse 76   Temp 98.5 F (36.9 C) (Oral)   Resp 16   SpO2 98%   Visual Acuity Right Eye Distance:   Left Eye Distance:   Bilateral  Distance:    Right Eye Near:   Left Eye Near:    Bilateral Near:     Physical Exam Vitals and nursing note reviewed.  Constitutional:      General: He is not in acute distress.    Appearance: He is well-developed.  HENT:     Head: Normocephalic and atraumatic.  Eyes:     Conjunctiva/sclera: Conjunctivae normal.  Cardiovascular:     Rate and Rhythm: Normal rate and regular rhythm.     Heart sounds: No murmur heard. Pulmonary:     Effort: Pulmonary effort is normal. No respiratory distress.     Breath sounds: Normal breath sounds.  Abdominal:     Palpations: Abdomen is soft.     Tenderness: There is no abdominal tenderness.  Musculoskeletal:        General: No swelling.     Cervical back: Neck supple.     Lumbar back: Spasms and tenderness present. No swelling, edema or deformity. Decreased range of motion. Negative right straight leg raise test and negative left straight leg raise test.  Skin:    General: Skin is warm and dry.     Capillary Refill: Capillary refill takes less than 2 seconds.  Neurological:     Mental Status: He is alert.  Psychiatric:        Mood and Affect: Mood normal.      UC Treatments / Results  Labs (all labs ordered are listed, but only abnormal results are displayed) Labs Reviewed - No data to display  EKG   Radiology No results found.  Procedures Procedures (including critical care time)  Medications Ordered in UC Medications - No data to display  Initial Impression / Assessment and Plan / UC Course  I have reviewed the triage vital signs and the nursing notes.  Pertinent labs & imaging results that were available during my care of the patient were reviewed by me and considered in my medical decision making (see chart for details).     Chronic bilateral low back pain without sciatica   Chronic back pain with acute exacerbation after physical therapy today.  We will try to control the pain with the following and if symptoms do  not improve then we will need to go to the emergency room for further evaluation: Toradol  injection given today. This is a medication to help with pain.  This is not a narcotic.  Decadron  injection given today. This is a steroid to help with inflammation and pain. Prednisone  40 mg (2 tablets) once daily for 5 days. Take this in the morning.  This is a steroid to help with inflammation and pain. Flexeril  10 mg every 8 hours as needed for muscle spasms.  Use caution as this medication can cause drowsiness.  If symptoms do not improve then go to the emergency room. Schedule a follow up as soon as possible with your Spine Specialist.   Final Clinical Impressions(s) / UC Diagnoses   Final diagnoses:  Chronic bilateral low back pain without sciatica     Discharge Instructions      Chronic back pain with acute exacerbation after physical therapy today.  We will try to control the pain with the following and if symptoms do not improve then we will need to go to the emergency room for further evaluation: Toradol  injection given today. This is a medication to help with pain. This is not a narcotic.  Decadron  injection given today. This is a steroid to help with inflammation and pain. Prednisone  40 mg (2 tablets) once daily for 5 days. Take this in the morning.  This is a steroid to help with inflammation and pain. Flexeril  10 mg every 8 hours as needed for muscle spasms.  Use caution as this medication can cause drowsiness.  If symptoms do not improve then go to the emergency room. Schedule a follow up as soon as possible with your Spine Specialist.     ED Prescriptions   None    PDMP not reviewed this encounter.   Teresa Almarie LABOR, [place] 02/18/24 1959

## 2024-02-18 NOTE — ED Triage Notes (Signed)
 Pt asking from waiting area if we will prescribe him medical marijuana. Informed pt we are unable to do so.

## 2024-05-17 ENCOUNTER — Encounter (HOSPITAL_COMMUNITY): Payer: Self-pay

## 2024-05-17 ENCOUNTER — Ambulatory Visit (HOSPITAL_COMMUNITY): Admission: EM | Admit: 2024-05-17 | Discharge: 2024-05-17 | Disposition: A | Discharge status: Home / Self Care

## 2024-05-17 DIAGNOSIS — M5442 Lumbago with sciatica, left side: Secondary | ICD-10-CM

## 2024-05-17 DIAGNOSIS — G8929 Other chronic pain: Secondary | ICD-10-CM | POA: Diagnosis not present

## 2024-05-17 DIAGNOSIS — M5441 Lumbago with sciatica, right side: Secondary | ICD-10-CM

## 2024-05-17 MED ORDER — DEXAMETHASONE SOD PHOSPHATE PF 10 MG/ML IJ SOLN
10.0000 mg | Freq: Once | INTRAMUSCULAR | Status: AC
  Administered 2024-05-17: 10 mg via INTRAMUSCULAR

## 2024-05-17 MED ORDER — METHOCARBAMOL 500 MG PO TABS: 500.0000 mg | ORAL_TABLET | Freq: Four times a day (QID) | ORAL | 0 refills | Status: AC | PRN

## 2024-05-17 MED ORDER — KETOROLAC TROMETHAMINE 30 MG/ML IJ SOLN
INTRAMUSCULAR | Status: AC
  Filled 2024-05-17: qty 1

## 2024-05-17 MED ORDER — KETOROLAC TROMETHAMINE 30 MG/ML IJ SOLN
30.0000 mg | Freq: Once | INTRAMUSCULAR | Status: AC
  Administered 2024-05-17: 30 mg via INTRAMUSCULAR

## 2024-05-17 NOTE — ED Triage Notes (Signed)
 Patient reports that he is having bilateral lower back pain R>L x 3 years. Patient states he just left the spinal specialist today. And was told he may have a lifetime of shots and therapy x 4 months. Patient also reports that he has pain radiating down both legs and having trembling x 4 months.
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
